# Patient Record
Sex: Female | Born: 1970 | Race: Black or African American | Hispanic: No | Marital: Married | Smoking: Never smoker
Health system: Southern US, Community
[De-identification: ages and names within clinical notes are randomized; demographics above are authoritative.]

## PROBLEM LIST (undated history)

## (undated) DIAGNOSIS — I10 Essential (primary) hypertension: Secondary | ICD-10-CM

## (undated) DIAGNOSIS — D869 Sarcoidosis, unspecified: Secondary | ICD-10-CM

## (undated) HISTORY — PX: LUNG BIOPSY: SHX232

---

## 2011-05-27 ENCOUNTER — Other Ambulatory Visit: Payer: Self-pay

## 2011-05-27 ENCOUNTER — Encounter (HOSPITAL_COMMUNITY): Payer: Self-pay | Admitting: *Deleted

## 2011-05-27 ENCOUNTER — Emergency Department (HOSPITAL_COMMUNITY)
Admission: EM | Admit: 2011-05-27 | Discharge: 2011-05-27 | Disposition: A | Discharge status: Home / Self Care | Attending: Emergency Medicine | Admitting: Emergency Medicine

## 2011-05-27 DIAGNOSIS — E876 Hypokalemia: Secondary | ICD-10-CM | POA: Insufficient documentation

## 2011-05-27 DIAGNOSIS — R002 Palpitations: Secondary | ICD-10-CM

## 2011-05-27 LAB — POCT I-STAT, CHEM 8
BUN: 10 mg/dL (ref 6–23)
Calcium, Ion: 1.17 mmol/L (ref 1.12–1.32)
Chloride: 106 mEq/L (ref 96–112)
Creatinine, Ser: 0.8 mg/dL (ref 0.50–1.10)
Glucose, Bld: 111 mg/dL — ABNORMAL HIGH (ref 70–99)
HCT: 39 % (ref 36.0–46.0)
Hemoglobin: 13.3 g/dL (ref 12.0–15.0)
Potassium: 3.3 mEq/L — ABNORMAL LOW (ref 3.5–5.1)
Sodium: 139 mEq/L (ref 135–145)
TCO2: 21 mmol/L (ref 0–100)

## 2011-05-27 LAB — PREGNANCY, URINE: Preg Test, Ur: NEGATIVE

## 2011-05-27 MED ORDER — POTASSIUM CHLORIDE CRYS ER 20 MEQ PO TBCR
20.0000 meq | EXTENDED_RELEASE_TABLET | Freq: Once | ORAL | Status: AC
  Administered 2011-05-27: 20 meq via ORAL
  Filled 2011-05-27: qty 1

## 2011-05-27 NOTE — ED Notes (Signed)
Report received from [place], [place]

## 2011-05-27 NOTE — ED Provider Notes (Signed)
History    41-year-old female with palpitations. Patient states that she feels like her is racing. Denies chest pain or shortness of breath. No unusual leg pain or swelling. Denies history of blood clot. Denies significant history of similar symptoms. Denies history of thyroid dysfunction. Patient does state that she recently started taking a diet pill for weight loss and also has been drinking diet shakes. Denies drug use. Does not think she is pregnant.  CSN: [id number]  Arrival date & time 05/27/11  1054   First MD Initiated Contact with Patient 05/27/11 1100      No chief complaint on file.   (Consider location/radiation/quality/duration/timing/severity/associated sxs/prior treatment) HPI  History reviewed. No pertinent past medical history.  History reviewed. No pertinent past surgical history.  No family history on file.  History  Substance Use Topics  . Smoking status: Not on file  . Smokeless tobacco: Not on file  . Alcohol Use: Not on file    OB History    Grav Para Term Preterm Abortions TAB SAB Ect Mult Living                  Review of Systems   Review of symptoms negative unless otherwise noted in HPI.   Allergies  Review of patient's allergies indicates not on file.  Home Medications   Current Outpatient Rx  Name Route Sig Dispense Refill  . OVER THE COUNTER MEDICATION Oral Take 1 tablet by mouth daily. Refirm PM diet pill      BP 153/101  Pulse 102  Resp 18  SpO2 100%  Physical Exam  Nursing note and vitals reviewed. Constitutional: She appears well-developed and well-nourished. No distress.  HENT:  Head: Normocephalic and atraumatic.  Eyes: Conjunctivae are normal. Right eye exhibits no discharge. Left eye exhibits no discharge.  Neck: Neck supple.  Cardiovascular: Normal rate, regular rhythm and normal heart sounds.  Exam reveals no gallop and no friction rub.   No murmur heard.      Regular rhythm and rate. No ectopic beats appreciated.  No flow murmur.  Pulmonary/Chest: Effort normal and breath sounds normal. No respiratory distress.  Abdominal: Soft. She exhibits no distension. There is no tenderness.  Musculoskeletal: She exhibits no edema and no tenderness.       Lower extremities symmetric as compared to each other. There is no edema. No calf tenderness. No palpable cords. Negative Homans  Neurological: She is alert.  Skin: Skin is warm and dry.  Psychiatric: She has a normal mood and affect. Her behavior is normal. Thought content normal.    ED Course  Procedures (including critical care time)  Labs Reviewed  POCT I-STAT, CHEM 8 - Abnormal; Notable for the following:    Potassium 3.3 (*)    Glucose, Bld 111 (*)    All other components within normal limits  PREGNANCY, URINE   No results found.  EKG:  Rhythm: NSR Rate: 79 Axis: normal Intervals: normal ST segments: NS ST changes.   1. Palpitations       MDM  41 year old female with palpitations. Suspect this is likely secondary to recent intake of diet pills and diet shakes.EKG with a normal sinus rhythm and normal intervals. Very mild hypokalemia but doubt this the etiology of patient's complaints. Patient is not pregnant.patient is hemodynamically stable. Consider PE but doubt. Patient counseled concerning the use of weight loss supplements. Return precautions were discussed. Outpatient followup.      Raeford Razor, MD 05/31/11 [phone]

## 2011-05-27 NOTE — ED Notes (Signed)
Denies chest pain, SOB, N/V with ems

## 2011-05-27 NOTE — Discharge Instructions (Signed)
Palpitations  A palpitation is the feeling that your heartbeat is irregular or is faster than normal. Although this is frightening, it usually is not serious. Palpitations may be caused by excesses of smoking, caffeine, or alcohol. They are also brought on by stress and anxiety. Sometimes, they are caused by heart disease. Unless otherwise noted, your caregiver did not find any signs of serious illness at this time. HOME CARE INSTRUCTIONS  To help prevent palpitations:  Drink decaffeinated coffee, tea, and soda pop. Avoid chocolate.   If you smoke or drink alcohol, quit or cut down as much as possible.   Reduce your stress or anxiety level. Biofeedback, yoga, or meditation will help you relax. Physical activity such as swimming, jogging, or walking also may be helpful.  SEEK MEDICAL CARE IF:   You continue to have a fast heartbeat.   Your palpitations occur more often.  SEEK IMMEDIATE MEDICAL CARE IF: You develop chest pain, shortness of breath, severe headache, dizziness, or fainting. Document Released: 03/18/2000 Document Revised: 12/01/2010 Document Reviewed: 05/18/2007 [facility] Patient Information 2012 [place], [place].  RESOURCE GUIDE  Dental Problems  Patients with Medicaid: [facility] [phone]                                           [phone] [address] Phone:  [phone]                                                  Phone:  [phone]  If unable to pay or uninsured, contact:  [facility] or [facility]. to become qualified for the adult dental clinic.  Chronic Pain Problems Contact [facility]  [phone] Patients need to be referred by their primary care doctor.  Insufficient Money for Medicine Contact [employer] Way:  call "211" or [facility] [phone].  No Primary Care Doctor Call [facility]  [phone] Other agencies that provide inexpensive medical care  [facility]  [phone]    [facility]  [phone]    [facility]  [phone]    [facility]  [phone]    [facility]  [phone]    [facility]  [phone]  Psychological Services [facility] Behavior[facility]  [phone] [facility] Services  [phone] [facility] Mental Health   [phone] (emergency services [phone])  Substance Abuse Resources [facility]  [phone] [facility] [phone] [facility] [phone] Floydene Flock [phone] Residential & Outpatient Substance Abuse Program  [phone]  Abuse/Neglect [facility] Child Abuse Hotline 424-733-7913 [facility] Child Abuse Hotline 743-039-2089 (After Hours)  Emergency Shelter [facility] Ministries [phone]  Maternity Homes [facility] at the [place] [facility] [place] [phone] [facility] [phone]  MRSA Hotline #:   520 585 1728    [facility] Resources  [facility]     [employer]                          [facility]. [address] [place]                       [address]      [address]  Blondell Reveal Phone:  [phone]                                   Phone:  [phone]                 Phone:  [phone]  [facility] Mental Health Phone:  [phone]  [facility] Child Abuse Hotline 925-769-1481 [phone] (After Hours)

## 2011-05-27 NOTE — ED Notes (Addendum)
Per EMS- pt took a diet pill last night. Began having palpitations, feeling anxious, and shakey, pt went to an urgent care where she was told to come to ED. Bp150/100 hr 96 o2 100%. 20G LAC. Per EMS pt appeared to have a run of V.Tach prior to arrival.

## 2012-02-16 ENCOUNTER — Other Ambulatory Visit (HOSPITAL_COMMUNITY): Payer: Self-pay | Admitting: Cardiovascular Disease

## 2012-02-16 DIAGNOSIS — I10 Essential (primary) hypertension: Secondary | ICD-10-CM

## 2012-02-23 ENCOUNTER — Encounter (HOSPITAL_COMMUNITY)

## 2012-03-08 ENCOUNTER — Encounter (HOSPITAL_COMMUNITY)

## 2013-03-06 ENCOUNTER — Ambulatory Visit: Payer: Self-pay | Admitting: *Deleted

## 2014-02-26 ENCOUNTER — Emergency Department: Payer: Self-pay | Admitting: Emergency Medicine

## 2015-12-23 ENCOUNTER — Emergency Department
Admission: EM | Admit: 2015-12-23 | Discharge: 2015-12-23 | Disposition: A | Discharge status: Home / Self Care | Payer: Self-pay | Attending: Emergency Medicine | Admitting: Emergency Medicine

## 2015-12-23 ENCOUNTER — Encounter: Payer: Self-pay | Admitting: Intensive Care

## 2015-12-23 ENCOUNTER — Emergency Department: Payer: Self-pay

## 2015-12-23 DIAGNOSIS — R079 Chest pain, unspecified: Secondary | ICD-10-CM | POA: Insufficient documentation

## 2015-12-23 DIAGNOSIS — D869 Sarcoidosis, unspecified: Secondary | ICD-10-CM | POA: Insufficient documentation

## 2015-12-23 DIAGNOSIS — I10 Essential (primary) hypertension: Secondary | ICD-10-CM | POA: Insufficient documentation

## 2015-12-23 DIAGNOSIS — Z791 Long term (current) use of non-steroidal anti-inflammatories (NSAID): Secondary | ICD-10-CM | POA: Insufficient documentation

## 2015-12-23 DIAGNOSIS — Z79899 Other long term (current) drug therapy: Secondary | ICD-10-CM | POA: Insufficient documentation

## 2015-12-23 HISTORY — DX: Sarcoidosis, unspecified: D86.9

## 2015-12-23 HISTORY — DX: Essential (primary) hypertension: I10

## 2015-12-23 LAB — BASIC METABOLIC PANEL
ANION GAP: 9 (ref 5–15)
BUN: 12 mg/dL (ref 6–20)
CALCIUM: 9.6 mg/dL (ref 8.9–10.3)
CHLORIDE: 104 mmol/L (ref 101–111)
CO2: 24 mmol/L (ref 22–32)
Creatinine, Ser: 1.08 mg/dL — ABNORMAL HIGH (ref 0.44–1.00)
GFR calc non Af Amer: >60 mL/min (ref >60)
GLUCOSE: 86 mg/dL (ref 65–99)
Potassium: 3.2 mmol/L — ABNORMAL LOW (ref 3.5–5.1)
Sodium: 137 mmol/L (ref 135–145)

## 2015-12-23 LAB — CBC
HCT: 42.6 % (ref 35.0–47.0)
HEMOGLOBIN: 14.7 g/dL (ref 12.0–16.0)
MCH: 30.3 pg (ref 26.0–34.0)
MCHC: 34.5 g/dL (ref 32.0–36.0)
MCV: 87.9 fL (ref 80.0–100.0)
Platelets: 200 10*3/uL (ref 150–440)
RBC: 4.85 MIL/uL (ref 3.80–5.20)
RDW: 14.2 % (ref 11.5–14.5)
WBC: 6.8 10*3/uL (ref 3.6–11.0)

## 2015-12-23 LAB — TROPONIN I

## 2015-12-23 MED ORDER — PREDNISONE 20 MG PO TABS: 40.0000 mg | ORAL_TABLET | Freq: Every day | ORAL | 0 refills | Status: DC

## 2015-12-23 MED ORDER — ALBUTEROL SULFATE HFA 108 (90 BASE) MCG/ACT IN AERS: 2.0000 | INHALATION_SPRAY | Freq: Four times a day (QID) | RESPIRATORY_TRACT | 2 refills | Status: AC | PRN

## 2015-12-23 MED ORDER — IPRATROPIUM-ALBUTEROL 0.5-2.5 (3) MG/3ML IN SOLN
3.0000 mL | Freq: Once | RESPIRATORY_TRACT | Status: AC
  Administered 2015-12-23: 3 mL via RESPIRATORY_TRACT
  Filled 2015-12-23: qty 3

## 2015-12-23 MED ORDER — IOPAMIDOL (ISOVUE-370) INJECTION 76%
75.0000 mL | Freq: Once | INTRAVENOUS | Status: AC | PRN
  Administered 2015-12-23: 75 mL via INTRAVENOUS

## 2015-12-23 MED ORDER — PREDNISONE 20 MG PO TABS
40.0000 mg | ORAL_TABLET | Freq: Once | ORAL | Status: AC
  Administered 2015-12-23: 40 mg via ORAL
  Filled 2015-12-23: qty 2

## 2015-12-23 NOTE — ED Triage Notes (Signed)
Pt with shortness of breath and swelling of ankles  for one week. No acute respiratory distress noted.

## 2015-12-23 NOTE — ED Notes (Signed)
Pt to radiology for US

## 2015-12-23 NOTE — ED Provider Notes (Signed)
[facility] Emergency Department Provider Note   ____________________________________________   First MD Initiated Contact with Patient 12/23/15 1114     (approximate)  I have reviewed the triage vital signs and the nursing notes.   HISTORY  Chief Complaint Shortness of Breath and Sarcoidosis    HPI Peggy Owens is a 45 y.o. female reports a previous history of sarcoid. Patient reports history of steroids with good relief roughly a year ago and follows with pulmonologist [facility]  Patient reports that for the last 2 weeks she's been feeling as though she has occasional cough with twinges of sharp pain in the left mid chest. No wheezing or fever, and she currently reports that she is not short of breath but she will feel short of breath during episodes where she is coughing. She does report recent travel and drove about 20 hours straight to [place], thereafter she knows that she had some slight swelling in her legs and reports it isn't coming and going.  Presently denies feeling short of breath. Pain-free right now, reports the pain in twinges in her chest will come and go sometime lasting as much as 20 minutes for the last couple of weeks. She does not have any sense of tightness or pressure in the chest. She did not have any radiating pain up the back. Walking and exertion do not make the chest pain, bout. She describes as a fairly sharp feeling sometimes worsened by taking a deep breath.  Senna previous tubal ligation, denies pregnancy and does not take any hormones. She does not smoke.    Past Medical History:  Diagnosis Date  . Hypertension   . Sarcoidosis (HCC)     There are no active problems to display for this patient.   Past Surgical History:  Procedure Laterality Date  . LUNG BIOPSY Left     Prior to Admission medications   Medication Sig Start Date End Date Taking? Authorizing Provider  fluticasone (FLONASE) 50 MCG/ACT nasal  spray Place 1 spray into both nostrils daily.   Yes Historical Provider, MD  ibuprofen (ADVIL,MOTRIN) 200 MG tablet Take 200 mg by mouth every 6 (six) hours as needed.   Yes Historical Provider, MD  albuterol (PROVENTIL HFA;VENTOLIN HFA) 108 (90 Base) MCG/ACT inhaler Inhale 2 puffs into the lungs every 6 (six) hours as needed for wheezing or shortness of breath. 12/23/15   Sharyn Creamer, MD  predniSONE (DELTASONE) 20 MG tablet Take 2 tablets (40 mg total) by mouth daily with breakfast. 12/23/15   Sharyn Creamer, MD    Allergies Review of patient's allergies indicates no known allergies.  History reviewed. No pertinent family history.  Social History Social History  Substance Use Topics  . Smoking status: Never Smoker  . Smokeless tobacco: Never Used  . Alcohol use Yes     Comment: Occ    Review of Systems Constitutional: No fever/chills Eyes: No visual changes. ENT: No sore throat. Cardiovascular: See history of present illness Respiratory: See history of present illness Gastrointestinal: No abdominal pain.  No nausea, no vomiting.  No diarrhea.  No constipation. Genitourinary: Negative for dysuria. Musculoskeletal: Negative for back pain. Skin: Negative for rash. Neurological: Negative for headaches, focal weakness or numbness.  10-point ROS otherwise negative.  ____________________________________________   PHYSICAL EXAM:  VITAL SIGNS: ED Triage Vitals  Enc Vitals Group     BP 12/23/15 1034 (!) 169/106     Pulse Rate 12/23/15 1034 70     Resp 12/23/15 1034  16     Temp 12/23/15 1034 98 F (36.7 C)     Temp Source 12/23/15 1034 Oral     SpO2 12/23/15 1034 99 %     Weight 12/23/15 1036 174 lb (78.9 kg)     Height 12/23/15 1036 5\' 4"  (1.626 m)     Head Circumference --      Peak Flow --      Pain Score --      Pain Loc --      Pain Edu? --      Excl. in GC? --     Constitutional: Alert and oriented. Well appearing and in no acute distress. Eyes: Conjunctivae are normal.  PERRL. EOMI. Head: Atraumatic. Nose: No congestion/rhinnorhea. Mouth/Throat: Mucous membranes are moist.  Oropharynx non-erythematous. Neck: No stridor.   Cardiovascular: Normal rate, regular rhythm. Grossly normal heart sounds.  Good peripheral circulation. Respiratory: Normal respiratory effort.  No retractions. Lungs CTAB.Using full and clear sentences. Gastrointestinal: Soft and nontender. No distention. No abdominal bruits. No CVA tenderness. Musculoskeletal: No lower extremity tenderness nor edemaExcept for a trace amount of peripheral edema at the ankles.  No joint effusions. Neurologic:  Normal speech and language. No gross focal neurologic deficits are appreciated.  Skin:  Skin is warm, dry and intact. No rash noted. Psychiatric: Mood and affect are normal. Speech and behavior are normal.  ____________________________________________   LABS (all labs ordered are listed, but only abnormal results are displayed)  Labs Reviewed  BASIC METABOLIC PANEL - Abnormal; Notable for the following:       Result Value   Potassium 3.2 (*)    Creatinine, Ser 1.08 (*)    All other components within normal limits  CBC  TROPONIN I   ____________________________________________  EKG  ED ECG REPORT I, QUALE, MARK, the attending physician, personally viewed and interpreted this ECG.  Date: 12/23/2015 EKG Time: 1045 Rate: 70 Rhythm: normal sinus rhythm QRS Axis: normal Intervals: normal ST/T Wave abnormalities: normal Conduction Disturbances: none Narrative Interpretation: unremarkable  ____________________________________________  RADIOLOGY  Ct Angio Chest Pe W Or Wo Contrast  Result Date: 12/23/2015 CLINICAL DATA:  Swelling of the ankles beginning last month. Clinical diagnosis of sarcoid. Cough and chest pain. EXAM: CT ANGIOGRAPHY CHEST WITH CONTRAST TECHNIQUE: Multidetector CT imaging of the chest was performed using the standard protocol during bolus administration of  intravenous contrast. Multiplanar CT image reconstructions and MIPs were obtained to evaluate the vascular anatomy. CONTRAST:  75 cc Isovue 370 COMPARISON:  07/08/2013 FINDINGS: Cardiovascular: Pulmonary arterial opacification is excellent. No pulmonary emboli. Systemic arterial opacification is excellent. No atherosclerosis, aneurysm or dissection. No coronary artery calcification. Mediastinum/Nodes: No enlarged hilar or mediastinal lymph nodes. Previously seen lymphadenopathy is resolved. Lungs/Pleura: Increased interstitial markings diffusely. Micronodular pattern in the perihilar regions diffusely, consistent with clinical diagnosis of sarcoid. Few benign subpleural nodules. No evidence of infiltrate, collapse or pleural effusion. Upper Abdomen: Normal Musculoskeletal: Negative Review of the MIP images confirms the above findings. IMPRESSION: No pulmonary emboli or acute vascular pathology. Resolution of previously seen mediastinal lymphadenopathy. Persistent interstitial pulmonary prominence consistent with the clinical diagnosis of sarcoid. Electronically Signed   By: Paulina Fusi M.D.   On: 12/23/2015 13:25   US Venous Img Lower Bilateral  Result Date: 12/23/2015 CLINICAL DATA:  Lower extremity swelling around both ankles. EXAM: BILATERAL LOWER EXTREMITY VENOUS DOPPLER ULTRASOUND TECHNIQUE: Gray-scale sonography with graded compression, as well as color Doppler and duplex ultrasound were performed to evaluate the lower extremity deep venous systems  from the level of the common femoral vein and including the common femoral, femoral, profunda femoral, popliteal and calf veins including the posterior tibial, peroneal and gastrocnemius veins when visible. The superficial great saphenous vein was also interrogated. Spectral Doppler was utilized to evaluate flow at rest and with distal augmentation maneuvers in the common femoral, femoral and popliteal veins. COMPARISON:  None. FINDINGS: RIGHT LOWER EXTREMITY  Common Femoral Vein: No evidence of thrombus. Normal compressibility, respiratory phasicity and response to augmentation. Saphenofemoral Junction: No evidence of thrombus. Normal compressibility and flow on color Doppler imaging. Profunda Femoral Vein: No evidence of thrombus. Normal compressibility and flow on color Doppler imaging. Femoral Vein: No evidence of thrombus. Normal compressibility, respiratory phasicity and response to augmentation. Popliteal Vein: No evidence of thrombus. Normal compressibility, respiratory phasicity and response to augmentation. Calf Veins: Visualized right deep calf veins are patent without thrombus. LEFT LOWER EXTREMITY Common Femoral Vein: No evidence of thrombus. Normal compressibility, respiratory phasicity and response to augmentation. Saphenofemoral Junction: No evidence of thrombus. Normal compressibility and flow on color Doppler imaging. Profunda Femoral Vein: No evidence of thrombus. Normal compressibility and flow on color Doppler imaging. Femoral Vein: No evidence of thrombus. Normal compressibility, respiratory phasicity and response to augmentation. Popliteal Vein: No evidence of thrombus. Normal compressibility, respiratory phasicity and response to augmentation. Calf Veins: Visualized left deep calf veins are patent without thrombus. IMPRESSION: No evidence of deep venous thrombosis in the lower extremities. Electronically Signed   By: Richarda OverlieAdam  Henn M.D.   On: 12/23/2015 12:29    ____________________________________________   PROCEDURES  Procedure(s) performed: None  Procedures  Critical Care performed: No  ____________________________________________   INITIAL IMPRESSION / ASSESSMENT AND PLAN / ED COURSE  Pertinent labs & imaging results that were available during my care of the patient were reviewed by me and considered in my medical decision making (see chart for details).  Patient presents for evaluation of mild symptoms of dyspnea with  associated sharp left-sided chest pains for about the last 2 weeks. She associates this with coming back after a trip from [place], she does have some trace amount of edema in her ankles. No clear evidence of a DVT by clinical exam, but given the history it does raise suspicion for possible venous thromboembolism and we will further evaluate with CT with contrast. She also carries a history of sarcoid, and reports that she had somewhat similar symptoms a year ago and she was diagnosed, and that improved with treatment with steroids and inhalers. I doubt acute coronary syndrome, reassuring and normal EKG with a normal troponin. Atypical symptoms. No infectious symptoms. Describes a nonproductive cough, shortness of breath made worse during episodes of cough no other travel except in the [place].  Clinical Course     ____________________________________________   FINAL CLINICAL IMPRESSION(S) / ED DIAGNOSES  Final diagnoses:  Left sided chest pain  Sarcoidosis (HCC)      NEW MEDICATIONS STARTED DURING THIS VISIT:  New Prescriptions   ALBUTEROL (PROVENTIL HFA;VENTOLIN HFA) 108 (90 BASE) MCG/ACT INHALER    Inhale 2 puffs into the lungs every 6 (six) hours as needed for wheezing or shortness of breath.   PREDNISONE (DELTASONE) 20 MG TABLET    Take 2 tablets (40 mg total) by mouth daily with breakfast.     Note:  This document was prepared using Dragon voice recognition software and may include unintentional dictation errors.     Sharyn CreamerMark Quale, MD 12/23/15 1416

## 2015-12-23 NOTE — ED Notes (Signed)
Pt ambulated to bathroom. Tech hooked pt back up to monitor.

## 2015-12-23 NOTE — ED Triage Notes (Signed)
Pt presents to ER with c/o swollen ankles off and on since the beginning of August. Pt reports being diagnosed with sarcoidosis and finished trx about 8months ago. Pt reports symptoms coming back and experiencing coughing, sharp chest pain on L side, and SOB. No breathing difficulties noted in triage and pt ambulatory.

## 2016-08-14 ENCOUNTER — Emergency Department: Payer: Self-pay

## 2016-08-14 ENCOUNTER — Emergency Department
Admission: EM | Admit: 2016-08-14 | Discharge: 2016-08-14 | Disposition: A | Discharge status: Home / Self Care | Payer: Self-pay | Attending: Emergency Medicine | Admitting: Emergency Medicine

## 2016-08-14 ENCOUNTER — Encounter: Payer: Self-pay | Admitting: Intensive Care

## 2016-08-14 DIAGNOSIS — R0789 Other chest pain: Secondary | ICD-10-CM | POA: Insufficient documentation

## 2016-08-14 DIAGNOSIS — J90 Pleural effusion, not elsewhere classified: Secondary | ICD-10-CM | POA: Insufficient documentation

## 2016-08-14 DIAGNOSIS — Z79899 Other long term (current) drug therapy: Secondary | ICD-10-CM | POA: Insufficient documentation

## 2016-08-14 DIAGNOSIS — R079 Chest pain, unspecified: Secondary | ICD-10-CM

## 2016-08-14 DIAGNOSIS — I10 Essential (primary) hypertension: Secondary | ICD-10-CM | POA: Insufficient documentation

## 2016-08-14 LAB — TROPONIN I: Troponin I: <0.03 ng/mL (ref <0.03)

## 2016-08-14 LAB — BASIC METABOLIC PANEL
Anion gap: 6 (ref 5–15)
BUN: 12 mg/dL (ref 6–20)
CHLORIDE: 112 mmol/L — AB (ref 101–111)
CO2: 22 mmol/L (ref 22–32)
CREATININE: 1.32 mg/dL — AB (ref 0.44–1.00)
Calcium: 9 mg/dL (ref 8.9–10.3)
GFR calc Af Amer: 56 mL/min — ABNORMAL LOW (ref >60)
GFR calc non Af Amer: 48 mL/min — ABNORMAL LOW (ref >60)
Glucose, Bld: 103 mg/dL — ABNORMAL HIGH (ref 65–99)
Potassium: 3.7 mmol/L (ref 3.5–5.1)
Sodium: 140 mmol/L (ref 135–145)

## 2016-08-14 LAB — CBC
HCT: 40.8 % (ref 35.0–47.0)
Hemoglobin: 13.9 g/dL (ref 12.0–16.0)
MCH: 30.6 pg (ref 26.0–34.0)
MCHC: 34 g/dL (ref 32.0–36.0)
MCV: 89.9 fL (ref 80.0–100.0)
PLATELETS: 190 10*3/uL (ref 150–440)
RBC: 4.54 MIL/uL (ref 3.80–5.20)
RDW: 14.6 % — AB (ref 11.5–14.5)
WBC: 5.8 10*3/uL (ref 3.6–11.0)

## 2016-08-14 LAB — FIBRIN DERIVATIVES D-DIMER (ARMC ONLY): Fibrin derivatives D-dimer (ARMC): 1513.27 — ABNORMAL HIGH (ref 0.00–499.00)

## 2016-08-14 MED ORDER — SODIUM CHLORIDE 0.9 % IV BOLUS (SEPSIS)
1000.0000 mL | Freq: Once | INTRAVENOUS | Status: AC
Start: 2016-08-14 — End: 2016-08-14
  Administered 2016-08-14: 1000 mL via INTRAVENOUS

## 2016-08-14 MED ORDER — KETOROLAC TROMETHAMINE 60 MG/2ML IM SOLN
30.0000 mg | Freq: Once | INTRAMUSCULAR | Status: AC
  Administered 2016-08-14: 30 mg via INTRAMUSCULAR

## 2016-08-14 MED ORDER — NAPROXEN 500 MG PO TABS: 500.0000 mg | ORAL_TABLET | Freq: Two times a day (BID) | ORAL | 0 refills | Status: AC

## 2016-08-14 MED ORDER — KETOROLAC TROMETHAMINE 30 MG/ML IJ SOLN
INTRAMUSCULAR | Status: AC
  Filled 2016-08-14: qty 1

## 2016-08-14 MED ORDER — AZITHROMYCIN 250 MG PO TABS: ORAL_TABLET | ORAL | 0 refills | Status: AC

## 2016-08-14 MED ORDER — IOPAMIDOL (ISOVUE-370) INJECTION 76%
75.0000 mL | Freq: Once | INTRAVENOUS | Status: AC | PRN
  Administered 2016-08-14: 75 mL via INTRAVENOUS
  Filled 2016-08-14: qty 75

## 2016-08-14 NOTE — ED Provider Notes (Signed)
[facility] Emergency Department Provider Note   ____________________________________________   I have reviewed the triage vital signs and the nursing notes.   HISTORY  Chief Complaint Chest Pain   History limited by: Not Limited   HPI Peggy Owens is a 46 y.o. female who presents to the emergency department today because of concerns for chest pain. It is located on the left side of her chest. It started 2 days ago. Has been constant since then. It is gradually beginning worse. It is worse with pressure and in certain positions. It is worse with deep breaths. She has not had any significant cough with it. She has not had any measured fevers. Denies similar pain in the past.   Past Medical History:  Diagnosis Date  . Hypertension   . Sarcoidosis     There are no active problems to display for this patient.   Past Surgical History:  Procedure Laterality Date  . LUNG BIOPSY Left     Prior to Admission medications   Medication Sig Start Date End Date Taking? Authorizing Provider  albuterol (PROVENTIL HFA;VENTOLIN HFA) 108 (90 Base) MCG/ACT inhaler Inhale 2 puffs into the lungs every 6 (six) hours as needed for wheezing or shortness of breath. 12/23/15   Sharyn CreamerQuale, Mark, MD  fluticasone (FLONASE) 50 MCG/ACT nasal spray Place 1 spray into both nostrils daily.    [provider]  ibuprofen (ADVIL,MOTRIN) 200 MG tablet Take 200 mg by mouth every 6 (six) hours as needed.    [provider]  predniSONE (DELTASONE) 20 MG tablet Take 2 tablets (40 mg total) by mouth daily with breakfast. 12/23/15   Sharyn CreamerQuale, Mark, MD    Allergies Patient has no known allergies.  History reviewed. No pertinent family history.  Social History Social History  Substance Use Topics  . Smoking status: Never Smoker  . Smokeless tobacco: Never Used  . Alcohol use Yes     Comment: Occ    Review of Systems Constitutional: No fever/chills Eyes: No visual  changes. ENT: No sore throat. Cardiovascular: Positive for chest pain. Respiratory: Denies shortness of breath. Gastrointestinal: No abdominal pain.  No nausea, no vomiting.  No diarrhea.   Genitourinary: Negative for dysuria. Musculoskeletal: Negative for back pain. Skin: Negative for rash. Neurological: Negative for headaches, focal weakness or numbness.  ____________________________________________   PHYSICAL EXAM:  VITAL SIGNS: ED Triage Vitals  Enc Vitals Group     BP 08/14/16 1805 (!) 175/93     Pulse Rate 08/14/16 1805 (!) 118     Resp 08/14/16 1805 20     Temp 08/14/16 1805 99.4 F (37.4 C)     Temp Source 08/14/16 1805 Oral     SpO2 08/14/16 1805 98 %     Weight 08/14/16 1806 180 lb (81.6 kg)     Height 08/14/16 1806 5\' 4"  (1.626 m)     Head Circumference --      Peak Flow --      Pain Score 08/14/16 1805 7   Constitutional: Alert and oriented. Well appearing and in no distress. Eyes: Conjunctivae are normal. Normal extraocular movements. ENT   Head: Normocephalic and atraumatic.   Nose: No congestion/rhinnorhea.   Mouth/Throat: Mucous membranes are moist.   Neck: No stridor. Hematological/Lymphatic/Immunilogical: No cervical lymphadenopathy. Cardiovascular: Tachycardic, regular rhythm.  No murmurs, rubs, or gallops.  Respiratory: Normal respiratory effort without tachypnea nor retractions. Breath sounds are clear and equal bilaterally. No wheezes/rales/rhonchi. Gastrointestinal: Soft and non tender. No rebound. No guarding.  Genitourinary: Deferred Musculoskeletal: Normal range of motion in all extremities. No lower extremity edema. Neurologic:  Normal speech and language. No gross focal neurologic deficits are appreciated.  Skin:  Skin is warm, dry and intact. No rash noted. Psychiatric: Mood and affect are normal. Speech and behavior are normal. Patient exhibits appropriate insight and judgment.  ____________________________________________     LABS (pertinent positives/negatives)  Labs Reviewed  BASIC METABOLIC PANEL - Abnormal; Notable for the following:       Result Value   Chloride 112 (*)    Glucose, Bld 103 (*)    Creatinine, Ser 1.32 (*)    GFR calc non Af Amer 48 (*)    GFR calc Af Amer 56 (*)    All other components within normal limits  CBC - Abnormal; Notable for the following:    RDW 14.6 (*)    All other components within normal limits  FIBRIN DERIVATIVES D-DIMER ([facility] ONLY) - Abnormal; Notable for the following:    Fibrin derivatives D-dimer [facility]) 1,610.96 (*)    All other components within normal limits  TROPONIN I    ____________________________________________   EKG  I, Phineas Semen, attending physician, personally viewed and interpreted this EKG  EKG Time: 1806 Rate: 108 Rhythm: sinus tachycardia Axis: normal Intervals: qtc 458 QRS: narrow ST changes: no st elevation Impression: abnormal ekg   ____________________________________________    RADIOLOGY  CXR IMPRESSION:  Small left pleural effusion.    Associated mild left basilar opacity, likely atelectasis.   CT angio  IMPRESSION: No evidence of pulmonary embolism.  Small left pleural effusion.  Mild patchy opacities in the lingula and left lower lobe, likely atelectasis. ____________________________________________   PROCEDURES  Procedures  ____________________________________________   INITIAL IMPRESSION / ASSESSMENT AND PLAN / ED COURSE  Pertinent labs & imaging results that were available during my care of the patient were reviewed by me and considered in my medical decision making (see chart for details).  Patient presented to the emergency department today because of concern for chest pain. CXR showed a pleural effusion. Patient was tachycardic and d-dimer was checked. It was high. CT angio was negative for PE. Will place patient on antibiotics and NSAID.    ____________________________________________   FINAL CLINICAL IMPRESSION(S) / ED DIAGNOSES  Final diagnoses:  Left sided chest pain  Pleural effusion  Chest pain, unspecified type     Note: This dictation was prepared with Dragon dictation. Any transcriptional errors that result from this process are unintentional     Phineas Semen, MD 08/14/16 2102

## 2016-08-14 NOTE — Discharge Instructions (Signed)
Please seek medical attention for any high fevers, chest pain, shortness of breath, change in behavior, persistent vomiting, bloody stool or any other new or concerning symptoms.  

## 2016-08-14 NOTE — ED Triage Notes (Addendum)
Patient presents to ER with c/o central and left sided constant chest pressure that started Friday. Ambulatory in triage with no problems. No distress noted. Patient takes b/p medicine and ran out X3 weeks ago. Does not have a PCP and has not gotten meds refilled

## 2016-08-14 NOTE — ED Notes (Signed)
Patient transported to CT 

## 2017-04-05 ENCOUNTER — Encounter (HOSPITAL_COMMUNITY): Payer: Self-pay | Admitting: Emergency Medicine

## 2017-04-05 ENCOUNTER — Emergency Department (HOSPITAL_COMMUNITY): Payer: Self-pay

## 2017-04-05 ENCOUNTER — Emergency Department (HOSPITAL_COMMUNITY)
Admission: EM | Admit: 2017-04-05 | Discharge: 2017-04-05 | Disposition: A | Discharge status: Home / Self Care | Payer: Self-pay | Attending: Emergency Medicine | Admitting: Emergency Medicine

## 2017-04-05 ENCOUNTER — Other Ambulatory Visit: Payer: Self-pay

## 2017-04-05 DIAGNOSIS — Z79899 Other long term (current) drug therapy: Secondary | ICD-10-CM | POA: Insufficient documentation

## 2017-04-05 DIAGNOSIS — I10 Essential (primary) hypertension: Secondary | ICD-10-CM | POA: Insufficient documentation

## 2017-04-05 DIAGNOSIS — R0789 Other chest pain: Secondary | ICD-10-CM | POA: Insufficient documentation

## 2017-04-05 LAB — CBC
HEMATOCRIT: 38.8 % (ref 36.0–46.0)
Hemoglobin: 13 g/dL (ref 12.0–15.0)
MCH: 28.6 pg (ref 26.0–34.0)
MCHC: 33.5 g/dL (ref 30.0–36.0)
MCV: 85.5 fL (ref 78.0–100.0)
Platelets: 268 10*3/uL (ref 150–400)
RBC: 4.54 MIL/uL (ref 3.87–5.11)
RDW: 14.4 % (ref 11.5–15.5)
WBC: 3.7 10*3/uL — ABNORMAL LOW (ref 4.0–10.5)

## 2017-04-05 LAB — BASIC METABOLIC PANEL
Anion gap: 7 (ref 5–15)
BUN: 11 mg/dL (ref 6–20)
CHLORIDE: 105 mmol/L (ref 101–111)
CO2: 25 mmol/L (ref 22–32)
Calcium: 9.1 mg/dL (ref 8.9–10.3)
Creatinine, Ser: 1.16 mg/dL — ABNORMAL HIGH (ref 0.44–1.00)
GFR calc non Af Amer: 56 mL/min — ABNORMAL LOW (ref >60)
Glucose, Bld: 109 mg/dL — ABNORMAL HIGH (ref 65–99)
POTASSIUM: 3.6 mmol/L (ref 3.5–5.1)
Sodium: 137 mmol/L (ref 135–145)

## 2017-04-05 LAB — I-STAT BETA HCG BLOOD, ED (MC, WL, AP ONLY): I-stat hCG, quantitative: <5 m[IU]/mL (ref <5)

## 2017-04-05 LAB — I-STAT TROPONIN, ED
Troponin i, poc: 0 ng/mL (ref 0.00–0.08)
Troponin i, poc: 0 ng/mL (ref 0.00–0.08)

## 2017-04-05 MED ORDER — IOPAMIDOL (ISOVUE-300) INJECTION 61%
INTRAVENOUS | Status: AC
  Filled 2017-04-05: qty 75

## 2017-04-05 MED ORDER — TRAMADOL HCL 50 MG PO TABS
50.0000 mg | ORAL_TABLET | Freq: Four times a day (QID) | ORAL | 0 refills | Status: AC | PRN
Start: 2017-04-05 — End: ?

## 2017-04-05 MED ORDER — IOPAMIDOL (ISOVUE-300) INJECTION 61%
75.0000 mL | Freq: Once | INTRAVENOUS | Status: AC | PRN
  Administered 2017-04-05: 75 mL via INTRAVENOUS

## 2017-04-05 NOTE — Discharge Instructions (Signed)
Call your pulmonologist tomorrow and get seen either tomorrow or the next day.  Bring the paperwork we give you with you to show him

## 2017-04-05 NOTE — ED Triage Notes (Addendum)
Pt reports intermittent CP and lightheadedness for the past 2 weeks. No SOB. Pt also reports she has had bilateral ankle swelling for the past 8 months, but ankles have been hurting more than usual for the past few days. No recent travel.

## 2017-04-05 NOTE — ED Notes (Addendum)
Pt reports that she has had bilateral leg swelling and reports that she had similar  Symptoms 3 months ago, and that she was tested for blood clot but was negative. Pt is c/o of left sided chest, that with position changes pain worsens.

## 2017-04-05 NOTE — ED Provider Notes (Signed)
[facility]-EMERGENCY DEPT Provider Note   CSN: [id number] Arrival date & time: 04/05/17  1132     History   Chief Complaint Chief Complaint  Patient presents with  . Chest Pain  . Joint Swelling    HPI Peggy Owens is a 47 y.o. female.  Patient has a history of sarcoid.  She states she [facility] [place] and her sarcoid kind of waxes and wanes.  Patient complains of some left-sided chest discomfort.  No fever no chills no cough no shortness of breath no night sweats   The history is provided by the patient.  Chest Pain   This is a new problem. The current episode started 1 to 2 hours ago. The problem occurs rarely. The problem has not changed since onset.The pain is associated with movement. The pain is present in the substernal region. The pain is at a severity of 3/10. The pain is moderate. The quality of the pain is described as pressure-like. Pertinent negatives include no abdominal pain, no back pain, no cough and no headaches.  Pertinent negatives for past medical history include no seizures.    Past Medical History:  Diagnosis Date  . Hypertension   . Sarcoidosis     There are no active problems to display for this patient.   Past Surgical History:  Procedure Laterality Date  . LUNG BIOPSY Left     OB History    No data available       Home Medications    Prior to Admission medications   Medication Sig Start Date End Date Taking? Authorizing Provider  ibuprofen (ADVIL,MOTRIN) 200 MG tablet Take 200 mg by mouth every 6 (six) hours as needed for moderate pain.    Yes [provider]  albuterol (PROVENTIL HFA;VENTOLIN HFA) 108 (90 Base) MCG/ACT inhaler Inhale 2 puffs into the lungs every 6 (six) hours as needed for wheezing or shortness of breath. 12/23/15   Sharyn CreamerQuale, Mark, MD  fluticasone (FLONASE) 50 MCG/ACT nasal spray Place 1 spray into both nostrils daily.    [provider]  naproxen (NAPROSYN) 500  MG tablet Take 1 tablet (500 mg total) by mouth 2 (two) times daily with a meal. Patient not taking: Reported on 04/05/2017 08/14/16 08/14/17  Phineas SemenGoodman, Graydon, MD  predniSONE (DELTASONE) 20 MG tablet Take 2 tablets (40 mg total) by mouth daily with breakfast. Patient not taking: Reported on 04/05/2017 12/23/15   Sharyn CreamerQuale, Mark, MD  traMADol (ULTRAM) 50 MG tablet Take 1 tablet (50 mg total) by mouth every 6 (six) hours as needed. 04/05/17   Bethann BerkshireZammit, Novella Abraha, MD    Family History History reviewed. No pertinent family history.  Social History Social History   Tobacco Use  . Smoking status: Never Smoker  . Smokeless tobacco: Never Used  Substance Use Topics  . Alcohol use: Yes    Comment: Occ  . Drug use: No     Allergies   Patient has no known allergies.   Review of Systems Review of Systems  Constitutional: Negative for appetite change and fatigue.  HENT: Negative for congestion, ear discharge and sinus pressure.   Eyes: Negative for discharge.  Respiratory: Negative for cough.   Cardiovascular: Positive for chest pain.  Gastrointestinal: Negative for abdominal pain and diarrhea.  Genitourinary: Negative for frequency and hematuria.  Musculoskeletal: Negative for back pain.  Skin: Negative for rash.  Neurological: Negative for seizures and headaches.  Psychiatric/Behavioral: Negative for hallucinations.     Physical Exam Updated  Vital Signs BP (!) 156/113 (BP Location: Right Arm)   Pulse 71   Temp 98.2 F (36.8 C) (Oral)   Resp 16   Ht 5\' 4"  (1.626 m)   Wt 78 kg (172 lb)   LMP 03/29/2017 (Approximate)   SpO2 99%   BMI 29.52 kg/m   Physical Exam  Constitutional: She is oriented to person, place, and time. She appears well-developed.  HENT:  Head: Normocephalic.  Eyes: Conjunctivae and EOM are normal. No scleral icterus.  Neck: Neck supple. No tracheal deviation present. No thyromegaly present.  Cardiovascular: Normal rate and regular rhythm. Exam reveals no gallop and  no friction rub.  No murmur heard. Pulmonary/Chest: No stridor. She has no wheezes. She has no rales. She exhibits no tenderness.  Abdominal: She exhibits no distension. There is no tenderness. There is no rebound.  Musculoskeletal: Normal range of motion. She exhibits no edema.  Lymphadenopathy:    She has no cervical adenopathy.  Neurological: She is oriented to person, place, and time. She exhibits normal muscle tone. Coordination normal.  Skin: Skin is warm. No rash noted. No erythema.  Psychiatric: She has a normal mood and affect. Her behavior is normal.     ED Treatments / Results  Labs (all labs ordered are listed, but only abnormal results are displayed) Labs Reviewed  BASIC METABOLIC PANEL - Abnormal; Notable for the following components:      Result Value   Glucose, Bld 109 (*)    Creatinine, Ser 1.16 (*)    GFR calc non Af Amer 56 (*)    All other components within normal limits  CBC - Abnormal; Notable for the following components:   WBC 3.7 (*)    All other components within normal limits  I-STAT TROPONIN, ED  I-STAT BETA HCG BLOOD, ED ([facility], [facility], [facility] ONLY)  I-STAT TROPONIN, ED    EKG  EKG Interpretation  Date/Time:  Wednesday April 05 2017 11:50:53 EST Ventricular Rate:  91 PR Interval:    QRS Duration: 90 QT Interval:  361 QTC Calculation: 445 R Axis:   47 Text Interpretation:  Sinus rhythm Baseline wander in lead(s) II V4 V5 V6 Confirmed by Bethann Berkshire [id number]) on 04/05/2017 7:03:04 PM       Radiology Dg Chest 2 View  Result Date: 04/05/2017 CLINICAL DATA:  Intermittent chest pain for 2 weeks. EXAM: CHEST  2 VIEW COMPARISON:  Chest x-ray dated 08/14/2016. Chest CT dated 08/14/2016. FINDINGS: Prominent interstitial markings throughout the left lung. Perhaps mildly prominent interstitial markings within the right lung. No confluent opacity to suggest consolidating pneumonia. No pleural effusion or pneumothorax seen. No acute or suspicious osseous finding.  Heart size is normal. Mild prominence of the right paratracheal mediastinum, corresponding to the mild mediastinal lymphadenopathy demonstrated on previous CT. IMPRESSION: 1. New prominent interstitial markings throughout the left lung. Perhaps mildly prominent interstitial markings within the right lung. Findings are suspicious for pulmonary manifestation of patient's known sarcoidosis. Alternative considerations include atypical pneumonias such as fungal or viral and asymmetric pulmonary edema. 2. Mild prominence of the right paratracheal mediastinum, corresponding to the mild lymphadenopathy demonstrated on earlier chest CT, again compatible with patient's known sarcoidosis. 3. Heart size is normal. Electronically Signed   By: Bary Richard M.D.   On: 04/05/2017 12:43   Ct Chest W Contrast  Result Date: 04/05/2017 CLINICAL DATA:  BILATERAL leg swelling, similar symptoms 3 months ago, LEFT side chest pain, chronic dyspnea, history hypertension, sarcoidosis EXAM: CT CHEST WITH CONTRAST TECHNIQUE:  Multidetector CT imaging of the chest was performed during intravenous contrast administration. Sagittal and coronal MPR images reconstructed from axial data set. CONTRAST:  75mL ISOVUE-300 IOPAMIDOL (ISOVUE-300) INJECTION 61% IV COMPARISON:  08/14/2016 FINDINGS: Cardiovascular: Thoracic vascular structures grossly patent on nondedicated exam. Aorta normal caliber. No pericardial effusion. Mediastinum/Nodes: Esophagus unremarkable. Base of cervical region normal appearance. Mildly enlarged RIGHT paratracheal node 13 mm short axis image 37 little changed. LEFT aerated node 11 mm short axis image 45 previously 9 mm. Additional scattered normal size mediastinal nodes. Normal sized axillary nodes. Overall, thoracic adenopathy has improved since earlier studies from 2014 and 2015. Lungs/Pleura: Innumerable micornodules throughout the LEFT upper lobe and lingula new since prior exam. Additional scattered tiny nodules are seen  in RIGHT RIGHT upper lobe, minimally in RIGHT middle and BILATERAL lower lobes. Largest discrete nodule is in the anterolateral RIGHT upper lobe 6 mm diameter subpleural image 60. Associated central peribronchial thickening. No segmental consolidation, pleural effusion or pneumothorax. Upper Abdomen: Visualized portions of the liver, pancreas, adrenal glands and upper poles of the kidneys are unremarkable. Numerous low-attenuation tiny nodular foci are present within the spleen, new. Musculoskeletal: No acute osseous findings. IMPRESSION: Innumerable tiny miliary type micronodules throughout the LEFT upper lobe, less in RIGHT upper lobe, minimally in remaining lungs, new since 08/14/2016. Associated numerous low-attenuation foci within the spleen. Differential diagnosis includes TB and fungal disease. Sarcoidosis can also demonstrate pulmonary nodular disease as well as splenic granulomata and should also be considered, particularly since patient has a history of sarcoidosis. Findings called to Dr. Estell Harpin On 04/05/2017 at 2056 hrs. Electronically Signed   By: Ulyses Southward M.D.   On: 04/05/2017 20:58    Procedures Procedures (including critical care time)  Medications Ordered in ED Medications  iopamidol (ISOVUE-300) 61 % injection (not administered)  iopamidol (ISOVUE-300) 61 % injection 75 mL (75 mLs Intravenous Contrast Given 04/05/17 2023)     Initial Impression / Assessment and Plan / ED Course  I have reviewed the triage vital signs and the nursing notes.  Pertinent labs & imaging results that were available during my care of the patient were reviewed by me and considered in my medical decision making (see chart for details).     Patient has changes on her CT exam that could be infection but most likely is her sarcoid returning.  Patient states this is happened numerous times before patient wants to follow-up with her lung doctor tomorrow to get his opinion about the treatment.  Final  Clinical Impressions(s) / ED Diagnoses   Final diagnoses:  Atypical chest pain    ED Discharge Orders        Ordered    traMADol (ULTRAM) 50 MG tablet  Every 6 hours PRN     04/05/17 2118    The patient has a history of   Bethann Berkshire, MD 04/05/17 2122

## 2018-11-21 ENCOUNTER — Other Ambulatory Visit: Payer: Self-pay

## 2018-11-21 DIAGNOSIS — Z20822 Contact with and (suspected) exposure to covid-19: Secondary | ICD-10-CM

## 2018-11-22 LAB — NOVEL CORONAVIRUS, NAA: SARS-CoV-2, NAA: NOT DETECTED

## 2020-02-11 ENCOUNTER — Other Ambulatory Visit: Payer: Self-pay

## 2020-02-11 ENCOUNTER — Emergency Department
Admission: EM | Admit: 2020-02-11 | Discharge: 2020-02-11 | Disposition: A | Discharge status: Home / Self Care | Payer: Self-pay | Attending: Emergency Medicine | Admitting: Emergency Medicine

## 2020-02-11 ENCOUNTER — Emergency Department: Payer: Self-pay

## 2020-02-11 DIAGNOSIS — I10 Essential (primary) hypertension: Secondary | ICD-10-CM | POA: Insufficient documentation

## 2020-02-11 DIAGNOSIS — R2242 Localized swelling, mass and lump, left lower limb: Secondary | ICD-10-CM | POA: Insufficient documentation

## 2020-02-11 DIAGNOSIS — G44209 Tension-type headache, unspecified, not intractable: Secondary | ICD-10-CM | POA: Insufficient documentation

## 2020-02-11 DIAGNOSIS — M25473 Effusion, unspecified ankle: Secondary | ICD-10-CM

## 2020-02-11 LAB — CBC
HCT: 36.9 % (ref 36.0–46.0)
Hemoglobin: 11.5 g/dL — ABNORMAL LOW (ref 12.0–15.0)
MCH: 24.8 pg — ABNORMAL LOW (ref 26.0–34.0)
MCHC: 31.2 g/dL (ref 30.0–36.0)
MCV: 79.7 fL — ABNORMAL LOW (ref 80.0–100.0)
Platelets: 215 10*3/uL (ref 150–400)
RBC: 4.63 MIL/uL (ref 3.87–5.11)
RDW: 17 % — ABNORMAL HIGH (ref 11.5–15.5)
WBC: 3.7 10*3/uL — ABNORMAL LOW (ref 4.0–10.5)
nRBC: 0 % (ref 0.0–0.2)

## 2020-02-11 LAB — BASIC METABOLIC PANEL
Anion gap: 9 (ref 5–15)
BUN: 13 mg/dL (ref 6–20)
CO2: 25 mmol/L (ref 22–32)
Calcium: 9.2 mg/dL (ref 8.9–10.3)
Chloride: 105 mmol/L (ref 98–111)
Creatinine, Ser: 1.11 mg/dL — ABNORMAL HIGH (ref 0.44–1.00)
GFR, Estimated: >60 mL/min (ref >60)
Glucose, Bld: 89 mg/dL (ref 70–99)
Potassium: 3.7 mmol/L (ref 3.5–5.1)
Sodium: 139 mmol/L (ref 135–145)

## 2020-02-11 MED ORDER — CLONIDINE HCL 0.1 MG PO TABS
0.1000 mg | ORAL_TABLET | Freq: Once | ORAL | Status: AC
  Administered 2020-02-11: 0.1 mg via ORAL
  Filled 2020-02-11: qty 1

## 2020-02-11 MED ORDER — HYDROCODONE-ACETAMINOPHEN 5-325 MG PO TABS
1.0000 | ORAL_TABLET | Freq: Once | ORAL | Status: AC
  Administered 2020-02-11: 1 via ORAL
  Filled 2020-02-11: qty 1

## 2020-02-11 MED ORDER — IRBESARTAN 75 MG PO TABS
75.0000 mg | ORAL_TABLET | Freq: Once | ORAL | Status: AC
  Administered 2020-02-11: 75 mg via ORAL
  Filled 2020-02-11: qty 1

## 2020-02-11 MED ORDER — VALSARTAN-HYDROCHLOROTHIAZIDE 80-12.5 MG PO TABS: 1.0000 | ORAL_TABLET | Freq: Every day | ORAL | 0 refills | Status: DC

## 2020-02-11 MED ORDER — IBUPROFEN 600 MG PO TABS
600.0000 mg | ORAL_TABLET | Freq: Once | ORAL | Status: AC
  Administered 2020-02-11: 600 mg via ORAL
  Filled 2020-02-11: qty 1

## 2020-02-11 MED ORDER — BUTALBITAL-APAP-CAFFEINE 50-325-40 MG PO TABS: 1.0000 | ORAL_TABLET | Freq: Four times a day (QID) | ORAL | 0 refills | Status: DC | PRN

## 2020-02-11 MED ORDER — CLONIDINE HCL 0.1 MG PO TABS: 0.2000 mg | ORAL_TABLET | Freq: Once | ORAL | Status: DC

## 2020-02-11 MED ORDER — BUTALBITAL-APAP-CAFFEINE 50-325-40 MG PO TABS: 1.0000 | ORAL_TABLET | Freq: Four times a day (QID) | ORAL | 0 refills | Status: AC | PRN

## 2020-02-11 NOTE — ED Provider Notes (Signed)
[facility] Emergency Department Provider Note  ____________________________________________   First MD Initiated Contact with Patient 02/11/20 1721     (approximate)  I have reviewed the triage vital signs and the nursing notes.   HISTORY  Chief Complaint Headache    HPI Peggy Owens is a 49 y.o. female with history of hypertension, sarcoidosis, here with headache and occasional ankle swelling.  The patient states that over the last several days, she has had a mild, generalized, headache.  She describes it as an ache, throbbing sensation.  It seems to be related to stressors at work and seems to be worse when she is at work.  She is also noticed some intermittent swelling in her ankles.  This also seems to get worse at work when she is sitting down, improves when she is not working or when she elevates her feet.  No actual leg pain.  Denies any focal numbness or weakness.  No photophobia or phonophobia.  No fevers or chills.  No recent illness.  Regarding her sarcoidosis, she is not currently on anything.  She has a history of pulmonary involvement but denies any cough, shortness of breath, or other symptoms concerning for active sarcoidosis flare.        Past Medical History:  Diagnosis Date  . Hypertension   . Sarcoidosis     There are no problems to display for this patient.   Past Surgical History:  Procedure Laterality Date  . LUNG BIOPSY Left     Prior to Admission medications   Medication Sig Start Date End Date Taking? Authorizing Provider  albuterol (PROVENTIL HFA;VENTOLIN HFA) 108 (90 Base) MCG/ACT inhaler Inhale 2 puffs into the lungs every 6 (six) hours as needed for wheezing or shortness of breath. 12/23/15   Sharyn Creamer, MD  butalbital-acetaminophen-caffeine (FIORICET) [phone] MG tablet Take 1 tablet by mouth every 6 (six) hours as needed for headache. 02/11/20 02/10/21  Shaune Pollack, MD  fluticasone (FLONASE) 50 MCG/ACT nasal  spray Place 1 spray into both nostrils daily.    [provider]  ibuprofen (ADVIL,MOTRIN) 200 MG tablet Take 200 mg by mouth every 6 (six) hours as needed for moderate pain.     [provider]  predniSONE (DELTASONE) 20 MG tablet Take 2 tablets (40 mg total) by mouth daily with breakfast. Patient not taking: Reported on 04/05/2017 12/23/15   Sharyn Creamer, MD  traMADol (ULTRAM) 50 MG tablet Take 1 tablet (50 mg total) by mouth every 6 (six) hours as needed. 04/05/17   Bethann Berkshire, MD  valsartan-hydrochlorothiazide (DIOVAN HCT) 80-12.5 MG tablet Take 1 tablet by mouth daily. 02/11/20 03/12/20  Shaune Pollack, MD    Allergies Patient has no known allergies.  No family history on file.  Social History Social History   Tobacco Use  . Smoking status: Never Smoker  . Smokeless tobacco: Never Used  Substance Use Topics  . Alcohol use: Yes    Comment: Occ  . Drug use: No    Review of Systems  Review of Systems  Constitutional: Positive for fatigue. Negative for fever.  HENT: Negative for congestion and sore throat.   Eyes: Negative for visual disturbance.  Respiratory: Negative for cough and shortness of breath.   Cardiovascular: Positive for leg swelling. Negative for chest pain.  Gastrointestinal: Negative for abdominal pain, diarrhea, nausea and vomiting.  Genitourinary: Negative for flank pain.  Musculoskeletal: Negative for back pain and neck pain.  Skin: Negative for rash and wound.  Neurological: Positive  for headaches. Negative for weakness.  All other systems reviewed and are negative.    ____________________________________________  PHYSICAL EXAM:      VITAL SIGNS: ED Triage Vitals  Enc Vitals Group     BP 02/11/20 1447 (!) 190/117     Pulse Rate 02/11/20 1447 71     Resp 02/11/20 1447 19     Temp 02/11/20 1447 98.4 F (36.9 C)     Temp Source 02/11/20 1447 Oral     SpO2 02/11/20 1447 99 %     Weight --      Height --      Head Circumference --       Peak Flow --      Pain Score 02/11/20 1518 7     Pain Loc --      Pain Edu? --      Excl. in GC? --      Physical Exam Vitals and nursing note reviewed.  Constitutional:      General: She is not in acute distress.    Appearance: She is well-developed.  HENT:     Head: Normocephalic and atraumatic.  Eyes:     Conjunctiva/sclera: Conjunctivae normal.  Cardiovascular:     Rate and Rhythm: Normal rate and regular rhythm.     Heart sounds: Normal heart sounds. No murmur heard.  No friction rub.  Pulmonary:     Effort: Pulmonary effort is normal. No respiratory distress.     Breath sounds: Normal breath sounds. No wheezing or rales.  Abdominal:     General: There is no distension.     Palpations: Abdomen is soft.     Tenderness: There is no abdominal tenderness.  Musculoskeletal:     Cervical back: Neck supple.  Skin:    General: Skin is warm.     Capillary Refill: Capillary refill takes less than 2 seconds.  Neurological:     Mental Status: She is alert and oriented to person, place, and time.     Motor: No abnormal muscle tone.     Comments: Neurological Exam:  Mental Status: Alert and oriented to person, place, and time. Attention and concentration normal. Speech clear. Recent memory is intact. Cranial Nerves: Visual fields grossly intact. EOMI and PERRLA. No nystagmus noted. Facial sensation intact at forehead, maxillary cheek, and chin/mandible bilaterally. No facial asymmetry or weakness. Hearing grossly normal. Uvula is midline, and palate elevates symmetrically. Normal SCM and trapezius strength. Tongue midline without fasciculations. Motor: Muscle strength 5/5 in proximal and distal UE and LE bilaterally. No pronator drift. Muscle tone normal.  Sensation: Intact to light touch in upper and lower extremities distally bilaterally.  Gait: Normal without ataxia. Coordination: Normal FTN bilaterally.          ____________________________________________    LABS (all labs ordered are listed, but only abnormal results are displayed)  Labs Reviewed  CBC - Abnormal; Notable for the following components:      Result Value   WBC 3.7 (*)    Hemoglobin 11.5 (*)    MCV 79.7 (*)    MCH 24.8 (*)    RDW 17.0 (*)    All other components within normal limits  BASIC METABOLIC PANEL - Abnormal; Notable for the following components:   Creatinine, Ser 1.11 (*)    All other components within normal limits    ____________________________________________  EKG: Normal sinus rhythm, ventricular rate 79.  PR 158, QRS 84, QTc 416.  No acute ST elevations or depressions. ________________________________________  RADIOLOGY All imaging, including plain films, CT scans, and ultrasounds, independently reviewed by me, and interpretations confirmed via formal radiology reads.  ED MD interpretation:     Official radiology report(s): No results found.  ____________________________________________  PROCEDURES   Procedure(s) performed (including Critical Care):  Procedures  ____________________________________________  INITIAL IMPRESSION / MDM / ASSESSMENT AND PLAN / ED COURSE  As part of my medical decision making, I reviewed the following data within the electronic MEDICAL RECORD NUMBER Nursing notes reviewed and incorporated, Old chart reviewed, Notes from prior ED visits, and [place] Controlled Substance Database       *Kinnedy Mongiello was evaluated in Emergency Department on 02/11/2020 for the symptoms described in the history of present illness. She was evaluated in the context of the global COVID-19 pandemic, which necessitated consideration that the patient might be at risk for infection with the SARS-CoV-2 virus that causes COVID-19. Institutional protocols and algorithms that pertain to the evaluation of patients at risk for COVID-19 are in a state of rapid change based on information released by regulatory bodies including the CDC and federal and state  organizations. These policies and algorithms were followed during the patient's care in the ED.  Some ED evaluations and interventions may be delayed as a result of limited staffing during the pandemic.*     Medical Decision Making: 49 year old female here with mild generalized headache, hypertension, and intermittent ankle swelling.  I suspect symptoms are due to poorly controlled hypertension, likely due to not taking any medications, along with underlying hypertension as well as sarcoidosis.  Regarding her headache, no red flags.  No focal numbness or weakness.  Headache began gradually and is not concern for subarachnoid.  No fever, chills, or signs of meningitis or encephalitis.  Will treat with symptomatic care and blood pressure control.  Otherwise, her lab work is overall reassuring.  She has a slight creatinine elevation which I suspect is chronic.  She was previously on valsartan/HCTZ, which I think is reasonable for both renal protection as well as her ankle swelling and underlying sarcoidosis.  Regarding her sarcoidosis, refer to a PCP.  No evidence of active flare.  Return precautions given.  ____________________________________________  FINAL CLINICAL IMPRESSION(S) / ED DIAGNOSES  Final diagnoses:  Primary hypertension  Ankle swelling, unspecified laterality  Tension headache     MEDICATIONS GIVEN DURING THIS VISIT:  Medications  HYDROcodone-acetaminophen (NORCO/VICODIN) 5-325 MG per tablet 1 tablet (1 tablet Oral Given 02/11/20 1802)  ibuprofen (ADVIL) tablet 600 mg (600 mg Oral Given 02/11/20 1802)     ED Discharge Orders         Ordered    valsartan-hydrochlorothiazide (DIOVAN HCT) 80-12.5 MG tablet  Daily        02/11/20 1829    butalbital-acetaminophen-caffeine (FIORICET) 50-325-40 MG tablet  Every 6 hours PRN        02/11/20 1829           Note:  This document was prepared using Dragon voice recognition software and may include unintentional dictation errors.     Shaune Pollack, MD 02/11/20 [phone]

## 2020-02-11 NOTE — ED Notes (Addendum)
Patient states she has had a headache for four days with swollen ankles.

## 2020-02-11 NOTE — ED Notes (Signed)
Discharge signature pad not working  

## 2020-02-11 NOTE — ED Triage Notes (Signed)
Pt comes via POV from home with c/o headache for 4 days. Pt denies any N/V, dizziness or blurred vision. Pt states no relief with medication.

## 2020-02-11 NOTE — Discharge Instructions (Addendum)
Call the [facility] or [facility] for follow-up  Take the blood pressure medication as prescribed

## 2020-02-11 NOTE — ED Notes (Signed)
Family at bedside. 

## 2020-05-31 ENCOUNTER — Other Ambulatory Visit: Payer: Self-pay

## 2020-05-31 ENCOUNTER — Emergency Department
Admission: EM | Admit: 2020-05-31 | Discharge: 2020-05-31 | Disposition: A | Discharge status: Home / Self Care | Payer: Self-pay | Attending: Emergency Medicine | Admitting: Emergency Medicine

## 2020-05-31 DIAGNOSIS — I10 Essential (primary) hypertension: Secondary | ICD-10-CM | POA: Insufficient documentation

## 2020-05-31 DIAGNOSIS — Z79899 Other long term (current) drug therapy: Secondary | ICD-10-CM | POA: Insufficient documentation

## 2020-05-31 DIAGNOSIS — T40711A Poisoning by cannabis, accidental (unintentional), initial encounter: Secondary | ICD-10-CM | POA: Insufficient documentation

## 2020-05-31 LAB — BASIC METABOLIC PANEL
Anion gap: 9 (ref 5–15)
BUN: 12 mg/dL (ref 6–20)
CO2: 20 mmol/L — ABNORMAL LOW (ref 22–32)
Calcium: 8.4 mg/dL — ABNORMAL LOW (ref 8.9–10.3)
Chloride: 103 mmol/L (ref 98–111)
Creatinine, Ser: 1.22 mg/dL — ABNORMAL HIGH (ref 0.44–1.00)
GFR, Estimated: 55 mL/min — ABNORMAL LOW (ref >60)
Glucose, Bld: 155 mg/dL — ABNORMAL HIGH (ref 70–99)
Potassium: 3.2 mmol/L — ABNORMAL LOW (ref 3.5–5.1)
Sodium: 132 mmol/L — ABNORMAL LOW (ref 135–145)

## 2020-05-31 LAB — URINE DRUG SCREEN, QUALITATIVE (ARMC ONLY)
Amphetamines, Ur Screen: NOT DETECTED
Barbiturates, Ur Screen: NOT DETECTED
Benzodiazepine, Ur Scrn: NOT DETECTED
Cannabinoid 50 Ng, Ur ~~LOC~~: POSITIVE — AB
Cocaine Metabolite,Ur ~~LOC~~: NOT DETECTED
MDMA (Ecstasy)Ur Screen: NOT DETECTED
Methadone Scn, Ur: NOT DETECTED
Opiate, Ur Screen: NOT DETECTED
Phencyclidine (PCP) Ur S: NOT DETECTED
Tricyclic, Ur Screen: NOT DETECTED

## 2020-05-31 LAB — URINALYSIS, COMPLETE (UACMP) WITH MICROSCOPIC
Bacteria, UA: NONE SEEN
Bilirubin Urine: NEGATIVE
Glucose, UA: NEGATIVE mg/dL
Hgb urine dipstick: NEGATIVE
Ketones, ur: NEGATIVE mg/dL
Leukocytes,Ua: NEGATIVE
Nitrite: NEGATIVE
Protein, ur: NEGATIVE mg/dL
Specific Gravity, Urine: 1.012 (ref 1.005–1.030)
pH: 5 (ref 5.0–8.0)

## 2020-05-31 LAB — CBC
HCT: 37 % (ref 36.0–46.0)
Hemoglobin: 11.7 g/dL — ABNORMAL LOW (ref 12.0–15.0)
MCH: 27.3 pg (ref 26.0–34.0)
MCHC: 31.6 g/dL (ref 30.0–36.0)
MCV: 86.2 fL (ref 80.0–100.0)
Platelets: 240 10*3/uL (ref 150–400)
RBC: 4.29 MIL/uL (ref 3.87–5.11)
RDW: 14.4 % (ref 11.5–15.5)
WBC: 5.6 10*3/uL (ref 4.0–10.5)
nRBC: 0 % (ref 0.0–0.2)

## 2020-05-31 LAB — POC URINE PREG, ED: Preg Test, Ur: NEGATIVE

## 2020-05-31 MED ORDER — SODIUM CHLORIDE 0.9 % IV BOLUS (SEPSIS)
1000.0000 mL | Freq: Once | INTRAVENOUS | Status: AC
  Administered 2020-05-31: 1000 mL via INTRAVENOUS

## 2020-05-31 MED ORDER — LORAZEPAM 2 MG/ML IJ SOLN
1.0000 mg | Freq: Once | INTRAMUSCULAR | Status: AC
  Administered 2020-05-31: 1 mg via INTRAVENOUS
  Filled 2020-05-31: qty 1

## 2020-05-31 NOTE — ED Notes (Signed)
Pt ambulatory to the restroom with a steady gait.

## 2020-05-31 NOTE — ED Provider Notes (Signed)
[facility] Emergency Department Provider Note  ____________________________________________   Event Date/Time   First MD Initiated Contact with Patient 05/31/20 [phone]     (approximate)  I have reviewed the triage vital signs and the nursing notes.   HISTORY  Chief Complaint Ingestion and Near Syncope    HPI Peggy Owens is a 50 y.o. female with hypertension, sarcoidosis who presents to the emergency department with feeling lightheaded and feels like her heart is racing and like she might pass out after eating a THC gummy and drinking alcohol around 10 PM tonight.  States she was not trying to harm herself.  She has never had a THC gummy before.  She feels like her palpitations have resolved but she is still feeling lightheaded and abnormal.  No vomiting or diarrhea.  No chest pain or shortness of breath.        Past Medical History:  Diagnosis Date  . Hypertension   . Sarcoidosis     There are no problems to display for this patient.   Past Surgical History:  Procedure Laterality Date  . LUNG BIOPSY Left     Prior to Admission medications   Medication Sig Start Date End Date Taking? Authorizing Provider  albuterol (PROVENTIL HFA;VENTOLIN HFA) 108 (90 Base) MCG/ACT inhaler Inhale 2 puffs into the lungs every 6 (six) hours as needed for wheezing or shortness of breath. 12/23/15   Sharyn Creamer, MD  butalbital-acetaminophen-caffeine (FIORICET) [phone] MG tablet Take 1 tablet by mouth every 6 (six) hours as needed for headache. 02/11/20 02/10/21  Shaune Pollack, MD  fluticasone (FLONASE) 50 MCG/ACT nasal spray Place 1 spray into both nostrils daily.    [provider]  ibuprofen (ADVIL,MOTRIN) 200 MG tablet Take 200 mg by mouth every 6 (six) hours as needed for moderate pain.     [provider]  predniSONE (DELTASONE) 20 MG tablet Take 2 tablets (40 mg total) by mouth daily with breakfast. Patient not taking: Reported on 04/05/2017  12/23/15   Sharyn Creamer, MD  traMADol (ULTRAM) 50 MG tablet Take 1 tablet (50 mg total) by mouth every 6 (six) hours as needed. 04/05/17   Bethann Berkshire, MD  valsartan-hydrochlorothiazide (DIOVAN HCT) 80-12.5 MG tablet Take 1 tablet by mouth daily. 02/11/20 03/12/20  Shaune Pollack, MD    Allergies Patient has no known allergies.  No family history on file.  Social History Social History   Tobacco Use  . Smoking status: Never Smoker  . Smokeless tobacco: Never Used  Substance Use Topics  . Alcohol use: Yes    Comment: Occ  . Drug use: Yes    Comment: CBD gummies    Review of Systems Constitutional: No fever. Eyes: No visual changes. ENT: No sore throat. Cardiovascular: Denies chest pain. Respiratory: Denies shortness of breath. Gastrointestinal: No nausea, vomiting, diarrhea. Genitourinary: Negative for dysuria. Musculoskeletal: Negative for back pain. Skin: Negative for rash. Neurological: Negative for focal weakness or numbness.  ____________________________________________   PHYSICAL EXAM:  VITAL SIGNS: ED Triage Vitals  Enc Vitals Group     BP 05/31/20 0049 (!) 170/106     Pulse Rate 05/31/20 0049 (!) 116     Resp 05/31/20 0049 20     Temp 05/31/20 0049 97.8 F (36.6 C)     Temp Source 05/31/20 0049 Oral     SpO2 05/31/20 0049 99 %     Weight 05/31/20 0055 180 lb (81.6 kg)     Height 05/31/20 0055 5\' 4"  (1.626 m)  Head Circumference --      Peak Flow --      Pain Score 05/31/20 0055 0     Pain Loc --      Pain Edu? --      Excl. in GC? --    CONSTITUTIONAL: Alert and oriented and responds appropriately to questions. Well-appearing; well-nourished HEAD: Normocephalic EYES: Conjunctivae clear, pupils appear equal, EOM appear intact ENT: normal nose; moist mucous membranes NECK: Supple, normal ROM CARD: Giller and tachycardic; S1 and S2 appreciated; no murmurs, no clicks, no rubs, no gallops RESP: Normal chest excursion without splinting or tachypnea;  breath sounds clear and equal bilaterally; no wheezes, no rhonchi, no rales, no hypoxia or respiratory distress, speaking full sentences ABD/GI: Normal bowel sounds; non-distended; soft, non-tender, no rebound, no guarding, no peritoneal signs, no hepatosplenomegaly BACK: The back appears normal EXT: Normal ROM in all joints; no deformity noted, no edema; no cyanosis SKIN: Normal color for age and race; warm; no rash on exposed skin NEURO: Moves all extremities equally, no facial asymmetry, normal speech, normal gait PSYCH: The patient's mood and manner are appropriate.  Denies SI or HI.  ____________________________________________   LABS (all labs ordered are listed, but only abnormal results are displayed)  Labs Reviewed  BASIC METABOLIC PANEL - Abnormal; Notable for the following components:      Result Value   Sodium 132 (*)    Potassium 3.2 (*)    CO2 20 (*)    Glucose, Bld 155 (*)    Creatinine, Ser 1.22 (*)    Calcium 8.4 (*)    GFR, Estimated 55 (*)    All other components within normal limits  CBC - Abnormal; Notable for the following components:   Hemoglobin 11.7 (*)    All other components within normal limits  URINALYSIS, COMPLETE (UACMP) WITH MICROSCOPIC - Abnormal; Notable for the following components:   Color, Urine STRAW (*)    APPearance CLEAR (*)    All other components within normal limits  URINE DRUG SCREEN, QUALITATIVE ([facility] ONLY) - Abnormal; Notable for the following components:   Cannabinoid 50 Ng, Ur [place] POSITIVE (*)    All other components within normal limits  POC URINE PREG, ED  CBG MONITORING, ED   ____________________________________________  EKG   EKG Interpretation  Date/Time:  Sunday May 31 2020 00:51:38 EST Ventricular Rate:  110 PR Interval:  174 QRS Duration: 88 QT Interval:  348 QTC Calculation: 470 R Axis:   26 Text Interpretation: Sinus tachycardia Cannot rule out Anterior infarct , age undetermined Abnormal ECG Confirmed by  Rochele Raring [phone]) on 05/31/2020 3:46:39 AM       ____________________________________________  RADIOLOGY Normajean Baxter Gentry Pilson, personally viewed and evaluated these images (plain radiographs) as part of my medical decision making, as well as reviewing the written report by the radiologist.  ED MD interpretation:  none  Official radiology report(s): No results found.  ____________________________________________   PROCEDURES  Procedure(s) performed (including Critical Care):  Procedures   ____________________________________________   INITIAL IMPRESSION / ASSESSMENT AND PLAN / ED COURSE  As part of my medical decision making, I reviewed the following data within the electronic MEDICAL RECORD NUMBER Nursing notes reviewed and incorporated, Labs reviewed , EKG interpreted tachycardia, Old EKG reviewed and Notes from prior ED visits         Patient here with dizziness, palpitations and near syncope after ingesting a THC gummy and drinking alcohol tonight.  She is tachycardic but otherwise hemodynamically stable and  EKG shows no arrhythmia, or interval abnormality or ischemia.  Labs here are unremarkable.  Will give IV fluids, Ativan and reassess.  She denies that this was an attempt to harm herself.    ED PROGRESS  Patient reports some improvement in symptoms.  Her heart rate has improved.  I feel she is safe to be discharged home with a sober driver in the morning.  Recommended that she avoid CBD Gummies in the future.  Patient verbalized understanding.  At this time, I do not feel there is any life-threatening condition present. I have reviewed, interpreted and discussed all results (EKG, imaging, lab, urine as appropriate) and exam findings with patient/family. I have reviewed nursing notes and appropriate previous records.  I feel the patient is safe to be discharged home without further emergent workup and can continue workup as an outpatient as needed. Discussed usual and  customary return precautions. Patient/family verbalize understanding and are comfortable with this plan.  Outpatient follow-up has been provided as needed. All questions have been answered.  ____________________________________________   FINAL CLINICAL IMPRESSION(S) / ED DIAGNOSES  Final diagnoses:  Cannabis overdose, accidental or unintentional, initial encounter     ED Discharge Orders    None      *Please note:  Peggy Owens was evaluated in Emergency Department on 05/31/2020 for the symptoms described in the history of present illness. She was evaluated in the context of the global COVID-19 pandemic, which necessitated consideration that the patient might be at risk for infection with the SARS-CoV-2 virus that causes COVID-19. Institutional protocols and algorithms that pertain to the evaluation of patients at risk for COVID-19 are in a state of rapid change based on information released by regulatory bodies including the CDC and federal and state organizations. These policies and algorithms were followed during the patient's care in the ED.  Some ED evaluations and interventions may be delayed as a result of limited staffing during and the pandemic.*   Note:  This document was prepared using Dragon voice recognition software and may include unintentional dictation errors.   Peggy Owens, Layla Maw, DO 05/31/20 [phone]

## 2020-05-31 NOTE — ED Notes (Signed)
Pt texting her daughter at this time. Pt states that daughter lives about 20 mins away.

## 2020-05-31 NOTE — ED Notes (Signed)
Per pt, she ate CBG gummy around 10pm and started feeling lightheaded and dizzy. Pt denies having CBD gummies before and does admit to drinking etoh tonight.   Pt appears in NAD at this time.

## 2020-05-31 NOTE — Discharge Instructions (Addendum)
Steps to find a Primary Care Provider (PCP):  Call [phone] or [phone] to access "[facility] Find a Doctor Service."  2.  You may also go on the  website at [email or web address]  

## 2020-05-31 NOTE — ED Notes (Signed)
Attempted to call pt's daughter Karie Georges, call went straight to voicemail. Will continue to attempt to call a ride.

## 2020-05-31 NOTE — ED Notes (Signed)
Pt accompanied by her husband was picked up by pt's daughter.

## 2020-05-31 NOTE — ED Triage Notes (Signed)
Pt arrived via [facility] for ingestion of 150mg  CBD gummy . Pt reports hx of HTN but does not take it everyday. Pt states she also had a couple shots of whiskey. Pt a&o x 4 responses appropriate but slow. Pt reported feeling lightheaded, dizzy, palpitations, feels "like im blacking out, but I dont know".  Pt denies any pain.   Ems vitals  146/100 HR 110 cbg 136

## 2020-11-30 ENCOUNTER — Other Ambulatory Visit: Payer: Self-pay

## 2020-11-30 ENCOUNTER — Ambulatory Visit
Admission: EM | Admit: 2020-11-30 | Discharge: 2020-11-30 | Disposition: A | Discharge status: Home / Self Care | Payer: Self-pay | Attending: Emergency Medicine | Admitting: Emergency Medicine

## 2020-11-30 DIAGNOSIS — I10 Essential (primary) hypertension: Secondary | ICD-10-CM

## 2020-11-30 DIAGNOSIS — L819 Disorder of pigmentation, unspecified: Secondary | ICD-10-CM

## 2020-11-30 MED ORDER — VALSARTAN-HYDROCHLOROTHIAZIDE 80-12.5 MG PO TABS: 1.0000 | ORAL_TABLET | Freq: Every day | ORAL | 0 refills | Status: AC

## 2020-11-30 MED ORDER — PREDNISONE 10 MG PO TABS: 10.0000 mg | ORAL_TABLET | Freq: Every day | ORAL | 0 refills | Status: AC

## 2020-11-30 NOTE — Discharge Instructions (Addendum)
Tylenol and ibuprofen as needed Continue to move and use hand, apply heat Restart valsartan and prednisone  IMPORTANT PATIENT INSTRUCTIONS FOR ULTRASOUND:  You have been scheduled for an Outpatient Vascular Study at [facility].    If tomorrow is a Saturday, Sunday or holiday, please go to the [facility] Emergency Department Registration Desk at 11 am tomorrow morning and tell them you are there for a vascular study.   If tomorrow is a weekday (Monday-Friday), please go to [facility] Registration at 11 am and tell them you are there for a vascular study.

## 2020-11-30 NOTE — ED Triage Notes (Signed)
Pt states her left hand turned purple today, states on Saturday she was washing dishes and a fork felt like it cut her but didn't on her left pointer finger, area is now purple per patient  .

## 2020-12-01 NOTE — ED Provider Notes (Signed)
[facility]    CSN: [id number] Arrival date & time: 11/30/20  1254      History   Chief Complaint Chief Complaint  Patient presents with   discoloration of hand     HPI Zelia Yzaguirre is a 50 y.o. female history of hypertension presenting today for evaluation of discoloration of left hand.  Reports that she noticed a purple discoloration to her fingers and palm of hand today.  Denies any associated pain.  Reports discoloration has slightly improved since initially noticing.  Denies associated pain.  Denies any notable injury or trauma, but does report prior to onset she did accidentally poke her index finger with the prong of a fork which caused initial pain and swelling.  Since she has had some continued soreness to PIP of left index finger.  Denies history of any circulation problems.  Denies any pain in forearm or upper arm.  Denies history of DVT/PE.  Denies tobacco use.  Patient does report her blood pressure is elevated has been out of her valsartan.  She also reports being off prednisone daily which she takes for sarcoidosis.  Is currently in between specialist and PCPs as she currently does not have insurance.  HPI  Past Medical History:  Diagnosis Date   Hypertension    Sarcoidosis     There are no problems to display for this patient.   Past Surgical History:  Procedure Laterality Date   LUNG BIOPSY Left     OB History   No obstetric history on file.      Home Medications    Prior to Admission medications   Medication Sig Start Date End Date Taking? Authorizing Provider  albuterol (PROVENTIL HFA;VENTOLIN HFA) 108 (90 Base) MCG/ACT inhaler Inhale 2 puffs into the lungs every 6 (six) hours as needed for wheezing or shortness of breath. 12/23/15   Sharyn Creamer, MD  butalbital-acetaminophen-caffeine (FIORICET) [phone] MG tablet Take 1 tablet by mouth every 6 (six) hours as needed for headache. 02/11/20 02/10/21  Shaune Pollack, MD  fluticasone (FLONASE)  50 MCG/ACT nasal spray Place 1 spray into both nostrils daily.    [provider]  ibuprofen (ADVIL,MOTRIN) 200 MG tablet Take 200 mg by mouth every 6 (six) hours as needed for moderate pain.     [provider]  predniSONE (DELTASONE) 10 MG tablet Take 1 tablet (10 mg total) by mouth daily with breakfast. 11/30/20   Hilberto Burzynski C, PA-C  traMADol (ULTRAM) 50 MG tablet Take 1 tablet (50 mg total) by mouth every 6 (six) hours as needed. 04/05/17   Bethann Berkshire, MD  valsartan-hydrochlorothiazide (DIOVAN HCT) 80-12.5 MG tablet Take 1 tablet by mouth daily. 11/30/20 02/28/21  Ollin Hochmuth, Junius Creamer, PA-C    Family History History reviewed. No pertinent family history.  Social History Social History   Tobacco Use   Smoking status: Never   Smokeless tobacco: Never  Substance Use Topics   Alcohol use: Yes    Comment: Occ   Drug use: Yes    Comment: CBD gummies     Allergies   Patient has no known allergies.   Review of Systems Review of Systems  Constitutional:  Negative for fatigue and fever.  HENT:  Negative for mouth sores.   Eyes:  Negative for visual disturbance.  Respiratory:  Negative for shortness of breath.   Cardiovascular:  Negative for chest pain.  Gastrointestinal:  Negative for abdominal pain, nausea and vomiting.  Genitourinary:  Negative for genital sores.  Musculoskeletal:  Negative for arthralgias and joint swelling.  Skin:  Positive for color change. Negative for rash and wound.  Neurological:  Negative for dizziness, weakness, light-headedness and headaches.    Physical Exam Triage Vital Signs ED Triage Vitals [11/30/20 1440]  Enc Vitals Group     BP (!) 177/126     Pulse Rate 88     Resp 18     Temp 98.1 F (36.7 C)     Temp Source Oral     SpO2 98 %     Weight      Height      Head Circumference      Peak Flow      Pain Score 0     Pain Loc      Pain Edu?      Excl. in GC?    No data found.  Updated Vital Signs BP (!)  177/126 (BP Location: Right Arm)   Pulse 88   Temp 98.1 F (36.7 C) (Oral)   Resp 18   LMP 10/18/2020   SpO2 98%   Visual Acuity Right Eye Distance:   Left Eye Distance:   Bilateral Distance:    Right Eye Near:   Left Eye Near:    Bilateral Near:     Physical Exam Vitals and nursing note reviewed.  Constitutional:      Appearance: She is well-developed.     Comments: No acute distress  HENT:     Head: Normocephalic and atraumatic.     Nose: Nose normal.  Eyes:     Conjunctiva/sclera: Conjunctivae normal.  Cardiovascular:     Rate and Rhythm: Normal rate.  Pulmonary:     Effort: Pulmonary effort is normal. No respiratory distress.  Abdominal:     General: There is no distension.  Musculoskeletal:        General: Normal range of motion.     Cervical back: Neck supple.  Skin:    General: Skin is warm and dry.     Comments: Left hand with notables purple discoloration noted to middle and distal phalanxes and thenar eminence on palmar aspect, cap refill brisk, no associated tenderness, full active range of motion of all 5 fingers, radial pulse 2+, no swelling or erythema noted proximally  Neurological:     Mental Status: She is alert and oriented to person, place, and time.     UC Treatments / Results  Labs (all labs ordered are listed, but only abnormal results are displayed) Labs Reviewed - No data to display  EKG   Radiology No results found.  Procedures Procedures (including critical care time)  Medications Ordered in UC Medications - No data to display  Initial Impression / Assessment and Plan / UC Course  I have reviewed the triage vital signs and the nursing notes.  Pertinent labs & imaging results that were available during my care of the patient were reviewed by me and considered in my medical decision making (see chart for details).     Unclear cause of discoloration of fingers, cap refill/circulation appears normal, no associated pain,  symptoms involving all 5 fingers and not isolated to finger with possible injury.  Low suspicion of underlying DVT, but may consider ultrasound to rule out if symptoms persistent.  Ultrasound order placed, in the interim recommended anti-inflammatories heat and close monitoring.  Restarting patient on valsartan as well as daily prednisone in case this at all could be playing a role as blood pressure is high today.  Discussed  warning signs with patient related to elevated blood pressure.  Discussed strict return precautions. Patient verbalized understanding and is agreeable with plan.    Final Clinical Impressions(s) / UC Diagnoses   Final diagnoses:  Discoloration of skin of hand  Essential hypertension     Discharge Instructions      Tylenol and ibuprofen as needed Continue to move and use hand, apply heat Restart valsartan and prednisone  IMPORTANT PATIENT INSTRUCTIONS FOR ULTRASOUND:  You have been scheduled for an Outpatient Vascular Study at [facility].    If tomorrow is a Saturday, Sunday or holiday, please go to the [facility] Emergency Department Registration Desk at 11 am tomorrow morning and tell them you are there for a vascular study.   If tomorrow is a weekday (Monday-Friday), please go to [facility] Registration at 11 am and tell them you are there for a vascular study.      ED Prescriptions     Medication Sig Dispense Auth. Provider   valsartan-hydrochlorothiazide (DIOVAN HCT) 80-12.5 MG tablet Take 1 tablet by mouth daily. 90 tablet Charlei Ramsaran C, PA-C   predniSONE (DELTASONE) 10 MG tablet Take 1 tablet (10 mg total) by mouth daily with breakfast. 30 tablet Javier Gell C, PA-C      PDMP not reviewed this encounter.   Lew Dawes, [place] 12/01/20 [phone]

## 2021-04-22 ENCOUNTER — Other Ambulatory Visit: Payer: Self-pay

## 2021-04-22 ENCOUNTER — Emergency Department
Admission: EM | Admit: 2021-04-22 | Discharge: 2021-04-22 | Disposition: A | Discharge status: Home / Self Care | Payer: Self-pay | Attending: Emergency Medicine | Admitting: Emergency Medicine

## 2021-04-22 ENCOUNTER — Emergency Department: Payer: Self-pay

## 2021-04-22 DIAGNOSIS — I10 Essential (primary) hypertension: Secondary | ICD-10-CM | POA: Insufficient documentation

## 2021-04-22 DIAGNOSIS — R079 Chest pain, unspecified: Secondary | ICD-10-CM

## 2021-04-22 LAB — BASIC METABOLIC PANEL
Anion gap: 7 (ref 5–15)
BUN: 9 mg/dL (ref 6–20)
CO2: 20 mmol/L — ABNORMAL LOW (ref 22–32)
Calcium: 9 mg/dL (ref 8.9–10.3)
Chloride: 109 mmol/L (ref 98–111)
Creatinine, Ser: 0.97 mg/dL (ref 0.44–1.00)
GFR, Estimated: >60 mL/min (ref >60)
Glucose, Bld: 112 mg/dL — ABNORMAL HIGH (ref 70–99)
Potassium: 4 mmol/L (ref 3.5–5.1)
Sodium: 136 mmol/L (ref 135–145)

## 2021-04-22 LAB — CBC
HCT: 41.8 % (ref 36.0–46.0)
Hemoglobin: 13.6 g/dL (ref 12.0–15.0)
MCH: 28.2 pg (ref 26.0–34.0)
MCHC: 32.5 g/dL (ref 30.0–36.0)
MCV: 86.7 fL (ref 80.0–100.0)
Platelets: 239 10*3/uL (ref 150–400)
RBC: 4.82 MIL/uL (ref 3.87–5.11)
RDW: 14 % (ref 11.5–15.5)
WBC: 5 10*3/uL (ref 4.0–10.5)
nRBC: 0 % (ref 0.0–0.2)

## 2021-04-22 LAB — TROPONIN I (HIGH SENSITIVITY): Troponin I (High Sensitivity): 3 ng/L (ref <18)

## 2021-04-22 MED ORDER — IRBESARTAN 75 MG PO TABS
75.0000 mg | ORAL_TABLET | Freq: Every day | ORAL | Status: DC
  Administered 2021-04-22: 75 mg via ORAL
  Filled 2021-04-22: qty 1

## 2021-04-22 MED ORDER — VALSARTAN-HYDROCHLOROTHIAZIDE 80-12.5 MG PO TABS: 1.0000 | ORAL_TABLET | ORAL | Status: DC

## 2021-04-22 MED ORDER — HYDROCHLOROTHIAZIDE 12.5 MG PO TABS
12.5000 mg | ORAL_TABLET | Freq: Every day | ORAL | Status: DC
  Administered 2021-04-22: 12.5 mg via ORAL
  Filled 2021-04-22: qty 1

## 2021-04-22 NOTE — ED Notes (Signed)
Called pt to room-no answer 

## 2021-04-22 NOTE — ED Notes (Signed)
Pt c/o L sided CP, onset this AM. Pt states she has a hx of sarcoidosis & PNA & it kind of feels like that, but she is not sure. Pain relieved when pt is not moving. Denies SHOB.

## 2021-04-22 NOTE — ED Provider Notes (Signed)
[facility] Provider Note    Event Date/Time   First MD Initiated Contact with Patient 04/22/21 1231     (approximate)   History   Chest Pain   HPI  Peggy Owens is a 51 y.o. female with past medical history of sarcoidosis and hypertension followed by PCP taking BP meds prescribed during previous ED visit who presents for assessment of some left-sided chest pain that started this morning.  Patient states she first felt it when she woke up.  It is reproducible when she pushes on the left side of her chest over the second.  In her rib space.  She states she felt little short of breath with it.  No cough, fevers, right-sided pain, back pain, nausea, vomiting, diarrhea, rash, urinary symptoms or any other acute sick symptoms.  She denies EtOH use illicit drug use or any recent traumatic injuries or falls.  No other acute concerns at this time.  She states she is compliant with her blood pressure medicines.      Physical Exam  Triage Vital Signs: ED Triage Vitals  Enc Vitals Group     BP 04/22/21 1126 (!) 185/121     Pulse Rate 04/22/21 1126 92     Resp 04/22/21 1126 20     Temp 04/22/21 1126 98.4 F (36.9 C)     Temp Source 04/22/21 1126 Oral     SpO2 04/22/21 1126 100 %     Weight 04/22/21 1121 190 lb (86.2 kg)     Height 04/22/21 1121 5\' 4"  (1.626 m)     Head Circumference --      Peak Flow --      Pain Score 04/22/21 1121 7     Pain Loc --      Pain Edu? --      Excl. in GC? --     Most recent vital signs: Vitals:   04/22/21 1126  BP: (!) 185/121  Pulse: 92  Resp: 20  Temp: 98.4 F (36.9 C)  SpO2: 100%    General: Awake, no distress.  CV:  Good peripheral perfusion.  No murmurs rubs or gallops.  2+ radial pulses. Resp:  Normal effort.  Clear bilaterally.  No increased effort. Abd:  No distention.  Soft throughout. Other:  Pain is reproducible on palpation of the left chest wall without any overlying skin changes.   ED Results /  Procedures / Treatments  Labs (all labs ordered are listed, but only abnormal results are displayed) Labs Reviewed  BASIC METABOLIC PANEL - Abnormal; Notable for the following components:      Result Value   CO2 20 (*)    Glucose, Bld 112 (*)    All other components within normal limits  CBC  POC URINE PREG, ED  TROPONIN I (HIGH SENSITIVITY)  TROPONIN I (HIGH SENSITIVITY)     EKG  EKG reviewed by myself shows sinus rhythm with a ventricular rate of 89 and some artifact in inferior leads without any other clear evidence of acute ischemia or significant arrhythmia.   RADIOLOGY  Chest x-ray reviewed by myself shows no focal consolidation, effusion, edema, pneumothorax or any other clear acute thoracic process.  Also reviewed radiology interpretation and agree with their findings.   PROCEDURES:  Critical Care performed: No  Procedures    MEDICATIONS ORDERED IN ED: Medications  irbesartan (AVAPRO) tablet 75 mg (has no administration in time range)    And  hydrochlorothiazide (HYDRODIURIL) tablet 12.5 mg (has no  administration in time range)     IMPRESSION / MDM / ASSESSMENT AND PLAN / ED COURSE  I reviewed the triage vital signs and the nursing notes.                              Differential diagnosis includes, but is not limited to pneumonia, pneumothorax, costochondritis versus other chest wall inflammation.  History and reproducibility is not suggestive of a dissection.  Low suspicion for PE as patient is PERC negative.  EKG reviewed by myself shows sinus rhythm with a ventricular rate of 89 and some artifact in inferior leads without any other clear evidence of acute ischemia or significant arrhythmia.  Low suspicion for ACS given troponin obtained greater than 3 hours after symptom onset.  Chest x-ray reviewed by myself shows no focal consolidation, effusion, edema, pneumothorax or any other clear acute thoracic process.  Also reviewed radiology interpretation and  agree with their findings.  CBC without leukocytosis or acute anemia.  Given absence of any focal consolidation or other abnormalities on chest x-ray without fever or leukocytosis I low suspicion for acute infectious process.  BMP without significant electrolyte or metabolic derangements  Given clear reproducibility of discomfort suspect likely some chest wall inflammation.  No evidence of shingles or rash.  I think patient's elevated blood pressure today is likely incidental I discussed this with patient and recommendation for close outpatient PCP follow-up so that she can have this rechecked and blood pressure medicines titrated as needed.  She is amenable this plan.  In meantime advised Tylenol and ibuprofen.  Discharged in stable condition.  Strict return precautions advised and discussed.      FINAL CLINICAL IMPRESSION(S) / ED DIAGNOSES   Final diagnoses:  Chest pain, unspecified type  Hypertension, unspecified type     Rx / DC Orders   ED Discharge Orders     None        Note:  This document was prepared using Dragon voice recognition software and may include unintentional dictation errors.   Gilles Chiquito, MD 04/22/21 1319

## 2021-04-22 NOTE — ED Notes (Signed)
Dr. Smith at bedside.

## 2021-04-22 NOTE — ED Notes (Signed)
Secure msg sent to pharmacy requesting irbesartan.

## 2021-04-22 NOTE — ED Triage Notes (Signed)
Pt to ED for left sided chest pain that started this am with intermittent shob that started today.  Denies n/v

## 2021-04-23 ENCOUNTER — Ambulatory Visit: Payer: Self-pay | Admitting: *Deleted

## 2021-04-23 NOTE — Telephone Encounter (Signed)
°  Chief Complaint: chest pain- was seen at ED yesterday Symptoms: midline chest pain Frequency: hurts when moves Pertinent Negatives: Patient denies  Disposition: [] ED /[x] Urgent Care (no appt availability in office) / [] Appointment(In office/virtual)/ []  [facility]/ [] Home Care/ [] Refused Recommended Disposition /[] [facility]/ []  Follow-up with PCP Additional Notes: Patient states she thought the ED was going to send Rx to pharmacy for BP medication- they asked her her pharmacy preference. Call to ED- after chart review - no notes reflecting that. Patient advised ED/UC as advised at discharge. Patient also given information on [facility] for follow up- no insurance.

## 2021-04-23 NOTE — Telephone Encounter (Signed)
Reason for Disposition  [1] Chest pain lasts > 5 minutes AND [2] occurred in past 3 days (72 hours) (Exception: feels exactly the same as previously diagnosed heartburn and has accompanying sour taste in mouth)  Answer Assessment - Initial Assessment Questions 1. LOCATION: "Where does it hurt?"       Middle of chest to shoulder 2. RADIATION: "Does the pain go anywhere else?" (e.g., into neck, jaw, arms, back)     *No Answer* 3. ONSET: "When did the chest pain begin?" (Minutes, hours or days)      Yesterday am- high BP -seen at ED 4. PATTERN "Does the pain come and go, or has it been constant since it started?"  "Does it get worse with exertion?"      Hurts when patient moves 5. DURATION: "How long does it last" (e.g., seconds, minutes, hours)     *No Answer* 6. SEVERITY: "How bad is the pain?"  (e.g., Scale 1-10; mild, moderate, or severe)    - MILD (1-3): doesn't interfere with normal activities     - MODERATE (4-7): interferes with normal activities or awakens from sleep    - SEVERE (8-10): excruciating pain, unable to do any normal activities       moderate 7. CARDIAC RISK FACTORS: "Do you have any history of heart problems or risk factors for heart disease?" (e.g., angina, prior heart attack; diabetes, high blood pressure, high cholesterol, smoker, or strong family history of heart disease)     High BP 8. PULMONARY RISK FACTORS: "Do you have any history of lung disease?"  (e.g., blood clots in lung, asthma, emphysema, birth control pills)     *No Answer* 9. CAUSE: "What do you think is causing the chest pain?"     *No Answer* 10. OTHER SYMPTOMS: "Do you have any other symptoms?" (e.g., dizziness, nausea, vomiting, sweating, fever, difficulty breathing, cough)       *No Answer* 11. PREGNANCY: "Is there any chance you are pregnant?" "When was your last menstrual period?"       *No Answer*  Protocols used: Chest Pain-A-AH

## 2022-03-10 IMAGING — CT CT HEAD W/O CM
3 series · 16 of 47 positions shown, 19 images · non-contrast
Comparison: None.

CLINICAL DATA: Headache.

EXAM:
CT HEAD WITHOUT CONTRAST
TECHNIQUE: Contiguous axial images were obtained from the base of the skull
through the vertex without intravenous contrast.

[Series 3: coronal soft tissue · coronal · 0.34mm/px · 3 of 65 slices shown]
[im 22/65  brain]
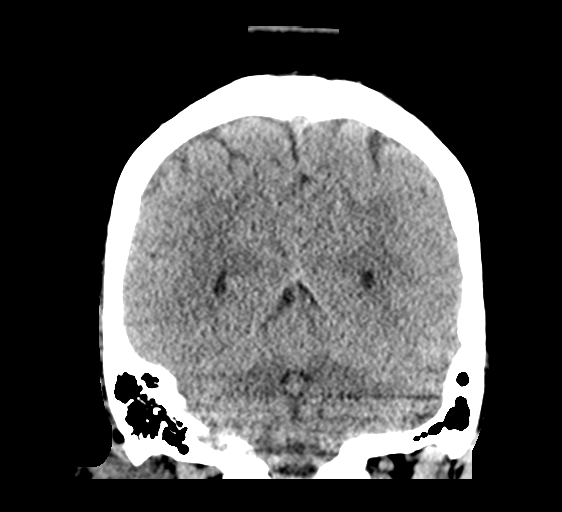
[im 29/65  brain]
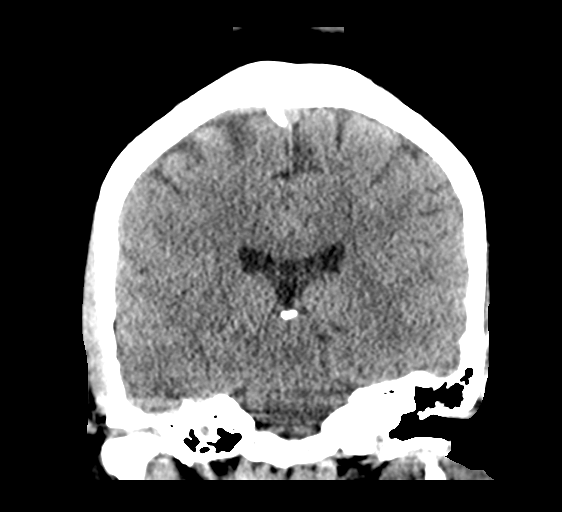
[im 36/65  brain]
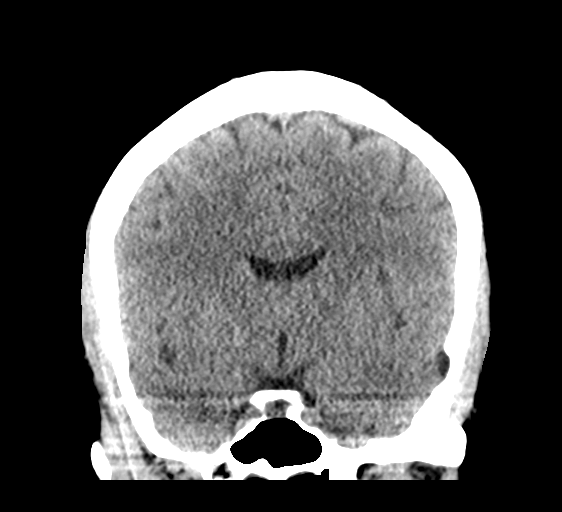

[Series 4: sagittal soft tissue · sagittal · 0.31mm/px · 3 of 50 slices shown]
[im 17/50  brain]
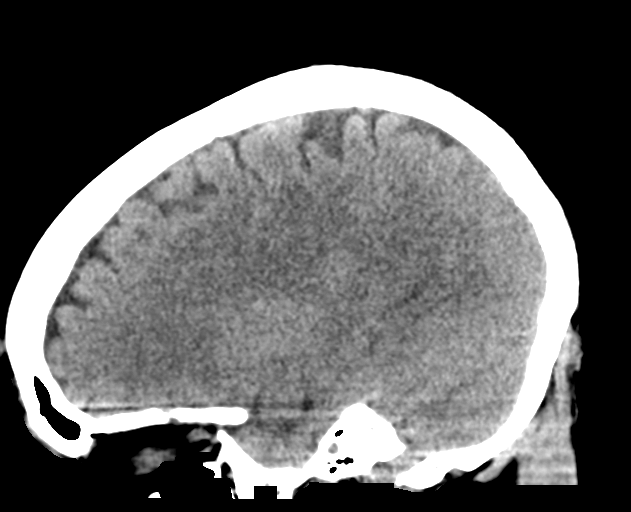
[im 25/50  brain]
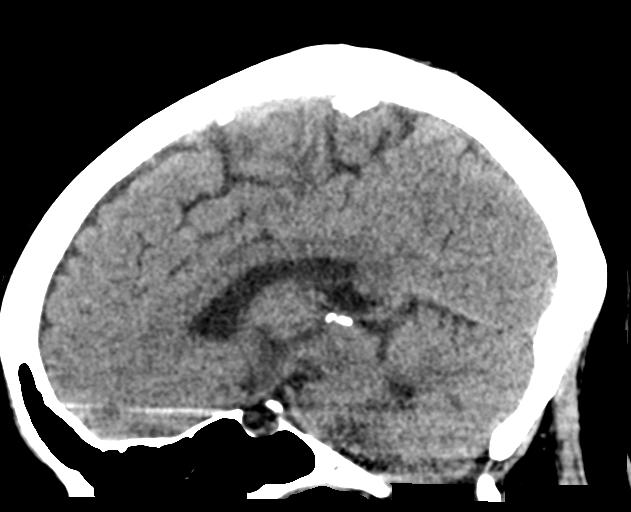
[im 33/50  brain]
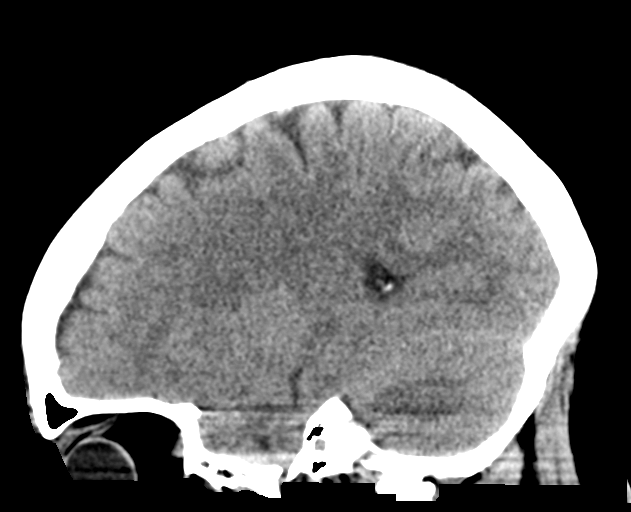

[Series 5: head wo · axial · 0.43mm/px · z∈[-236,-111]mm · 10 of 31 slices shown, 13 images]
[im 3/31  brain]
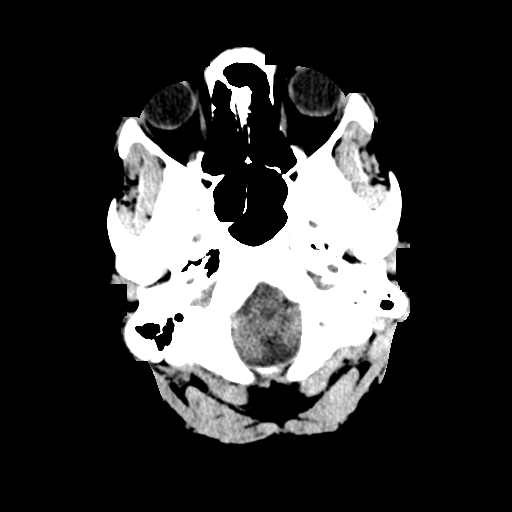
[im 3/31  bone]
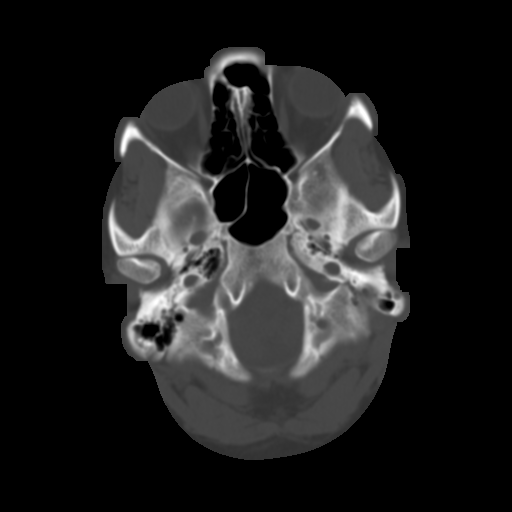
[im 6/31  brain]
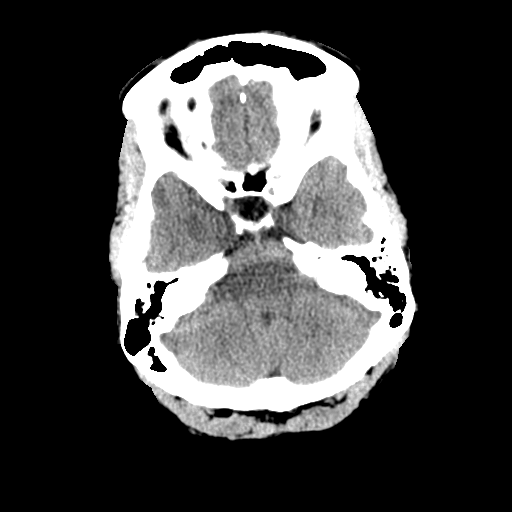
[im 9/31  brain]
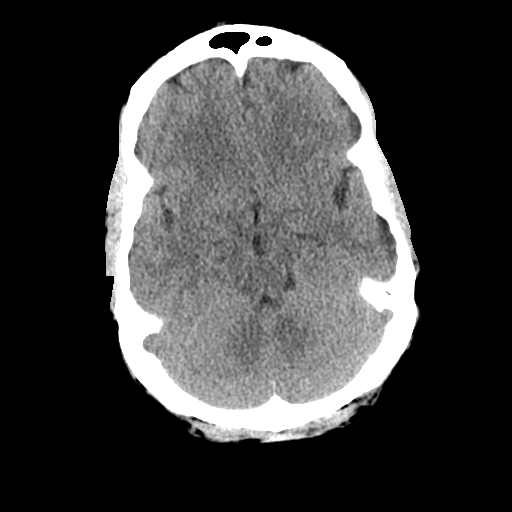
[im 11/31  brain]
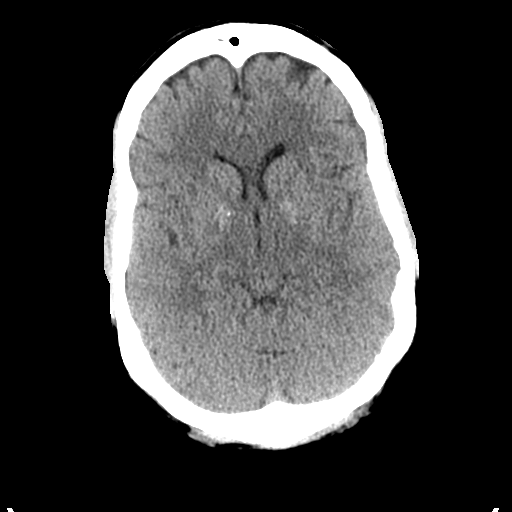
[im 14/31  brain]
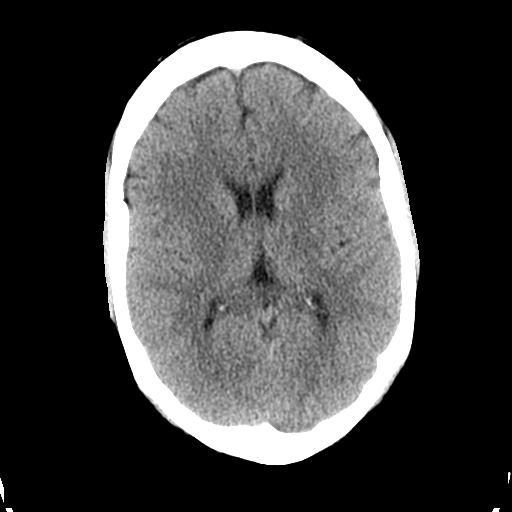
[im 14/31  bone]
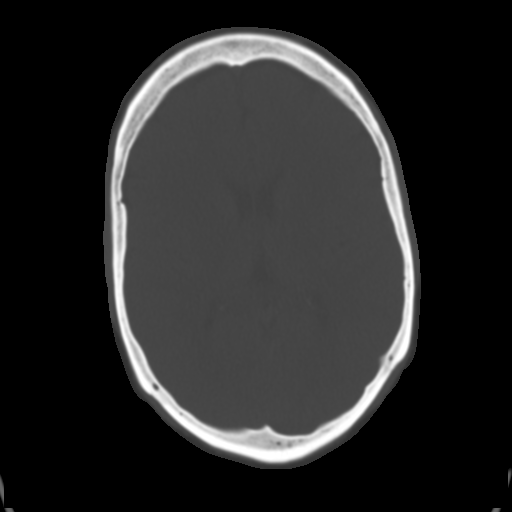
[im 17/31  brain]
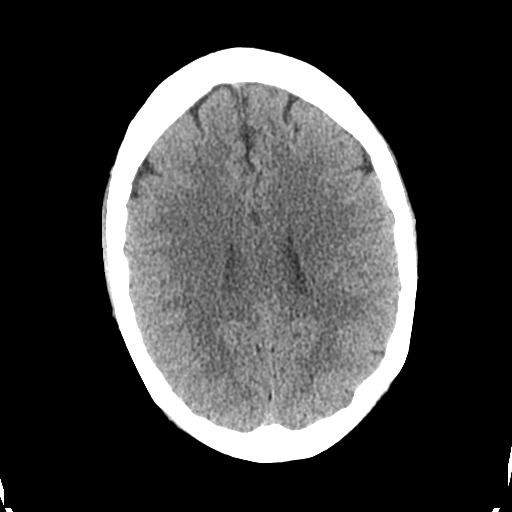
[im 20/31  brain]
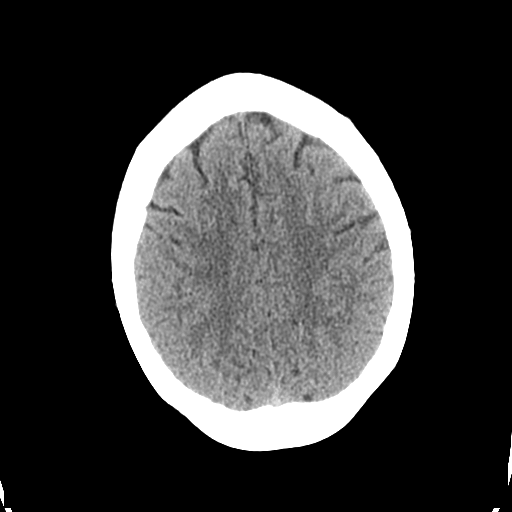
[im 23/31  brain]
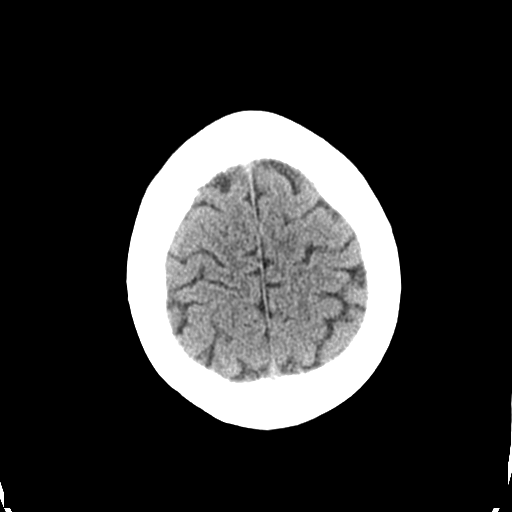
[im 25/31  brain]
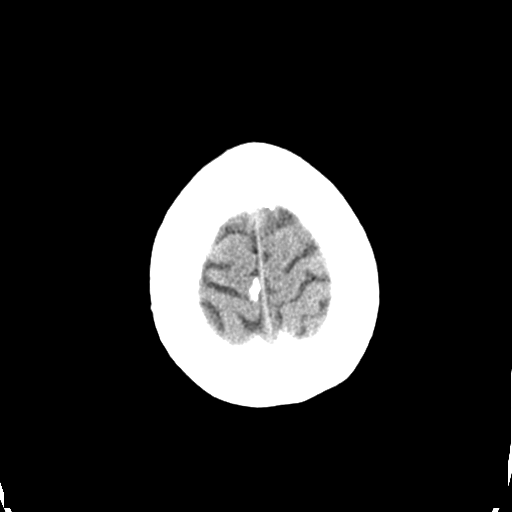
[im 25/31  bone]
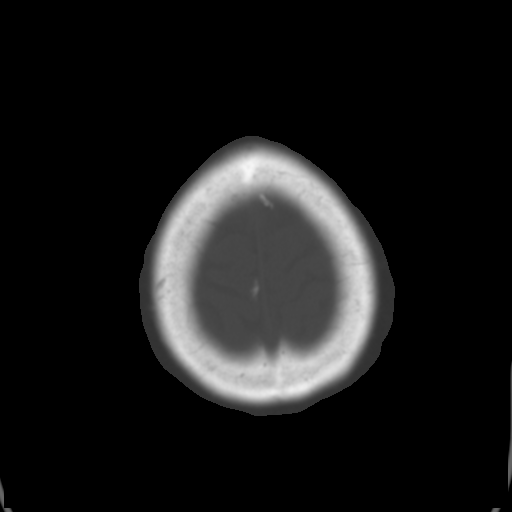
[im 28/31  brain]
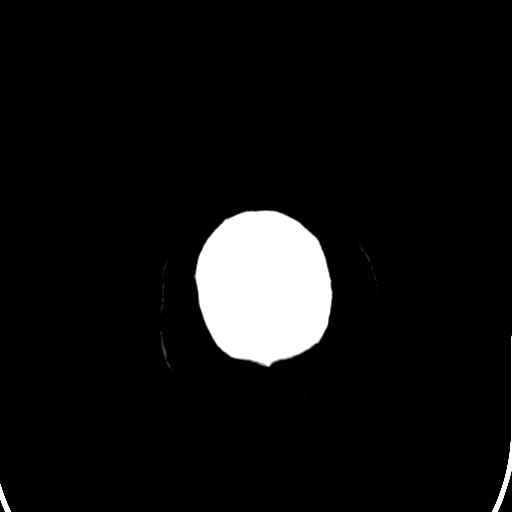

[16 of 47 positions shown; findings below may reference images not displayed]

FINDINGS: Brain: No evidence of acute infarction, hemorrhage, hydrocephalus,
extra-axial collection or mass lesion/mass effect.

Vascular: No hyperdense vessel or unexpected calcification.

Skull: Normal. Negative for fracture or focal lesion.

Sinuses/Orbits: No acute finding.

Other: None.
IMPRESSION: Normal head CT.

## 2023-05-20 IMAGING — CR DG CHEST 2V
1 series · 2 of 2 positions shown · non-contrast
Comparison: 04/05/2017, 08/14/2016

CLINICAL DATA: Chest pain

EXAM:
CHEST - 2 VIEW

[Series 1: w chest pa · 0.14mm/px · 2 of 2 slices shown]
[im 1/2]
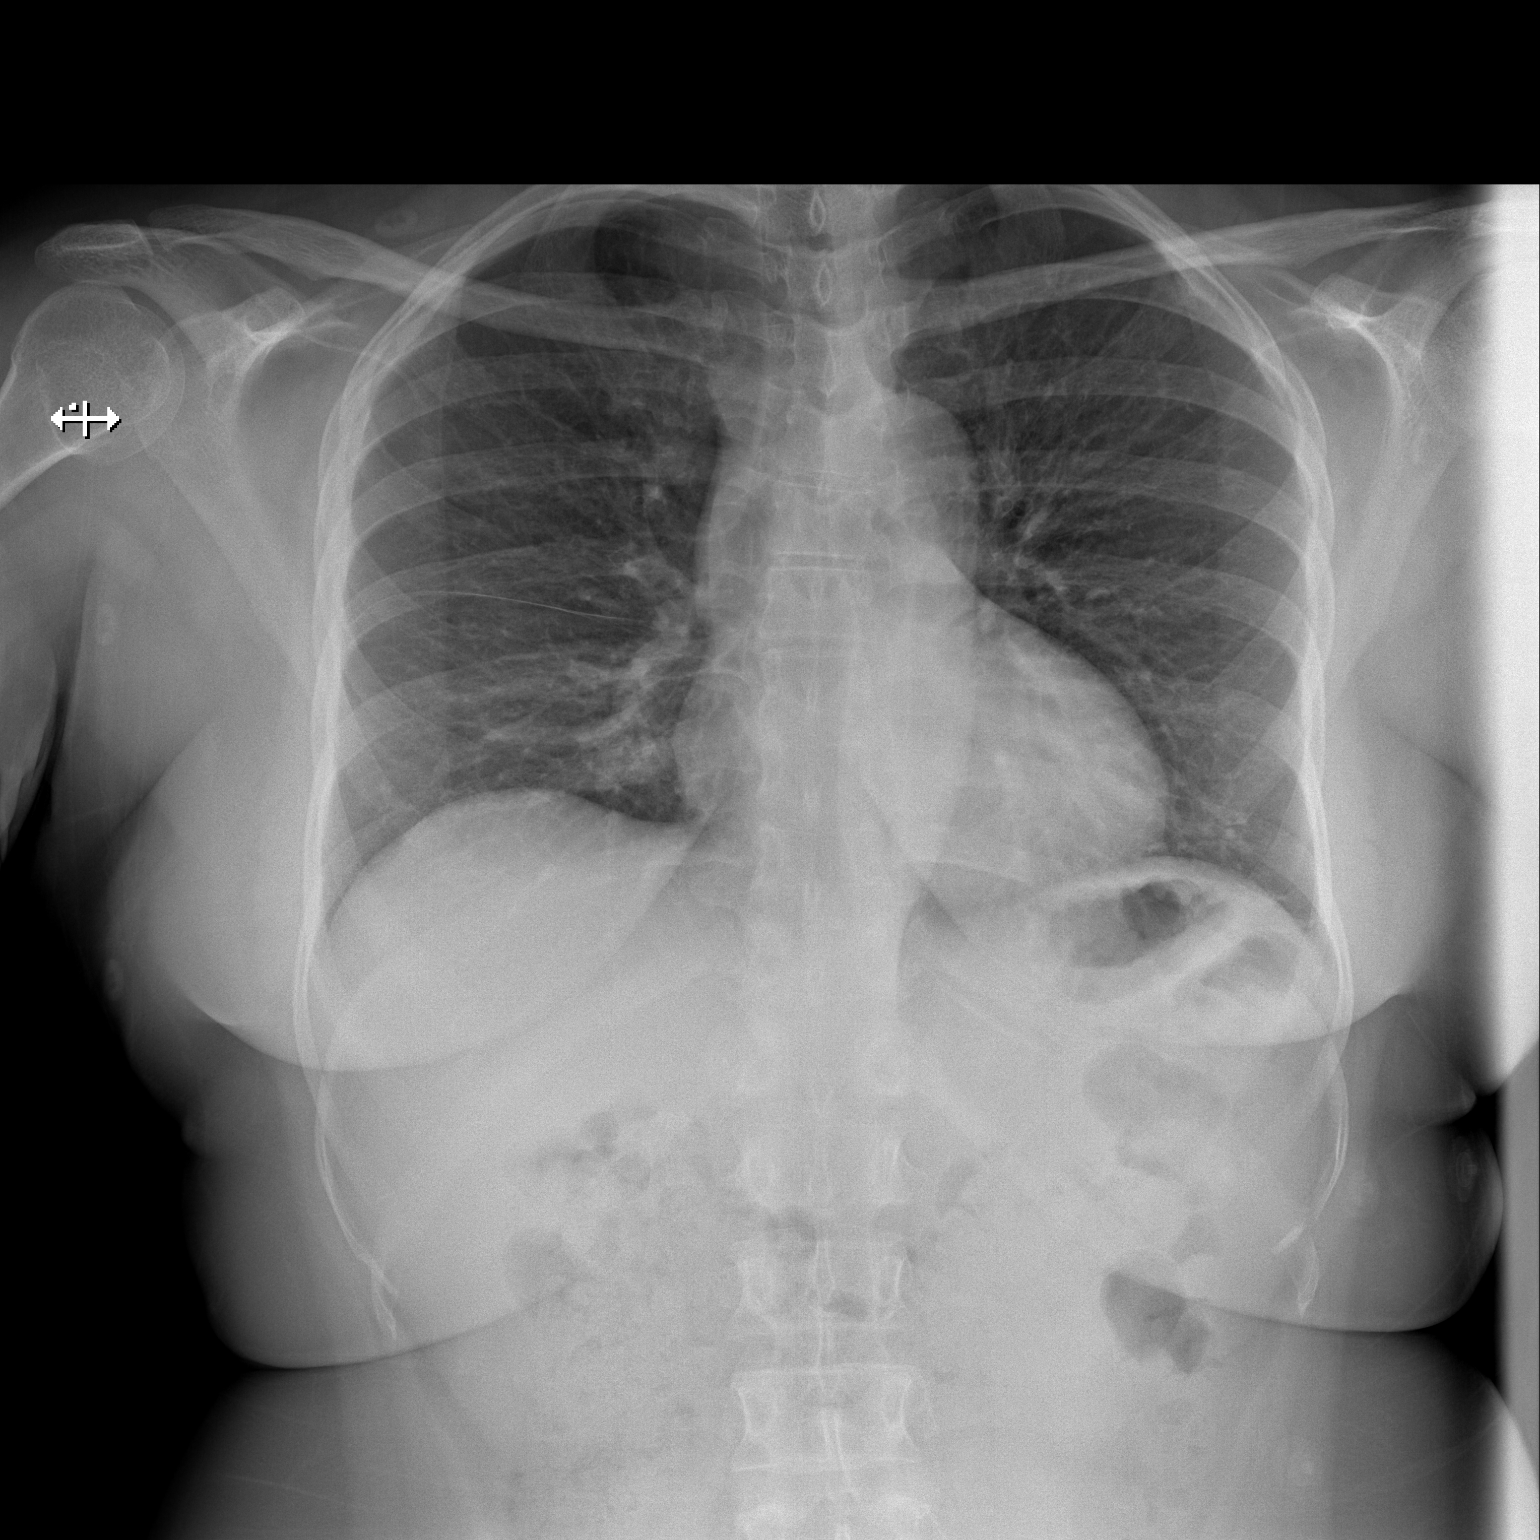
[im 2/2]
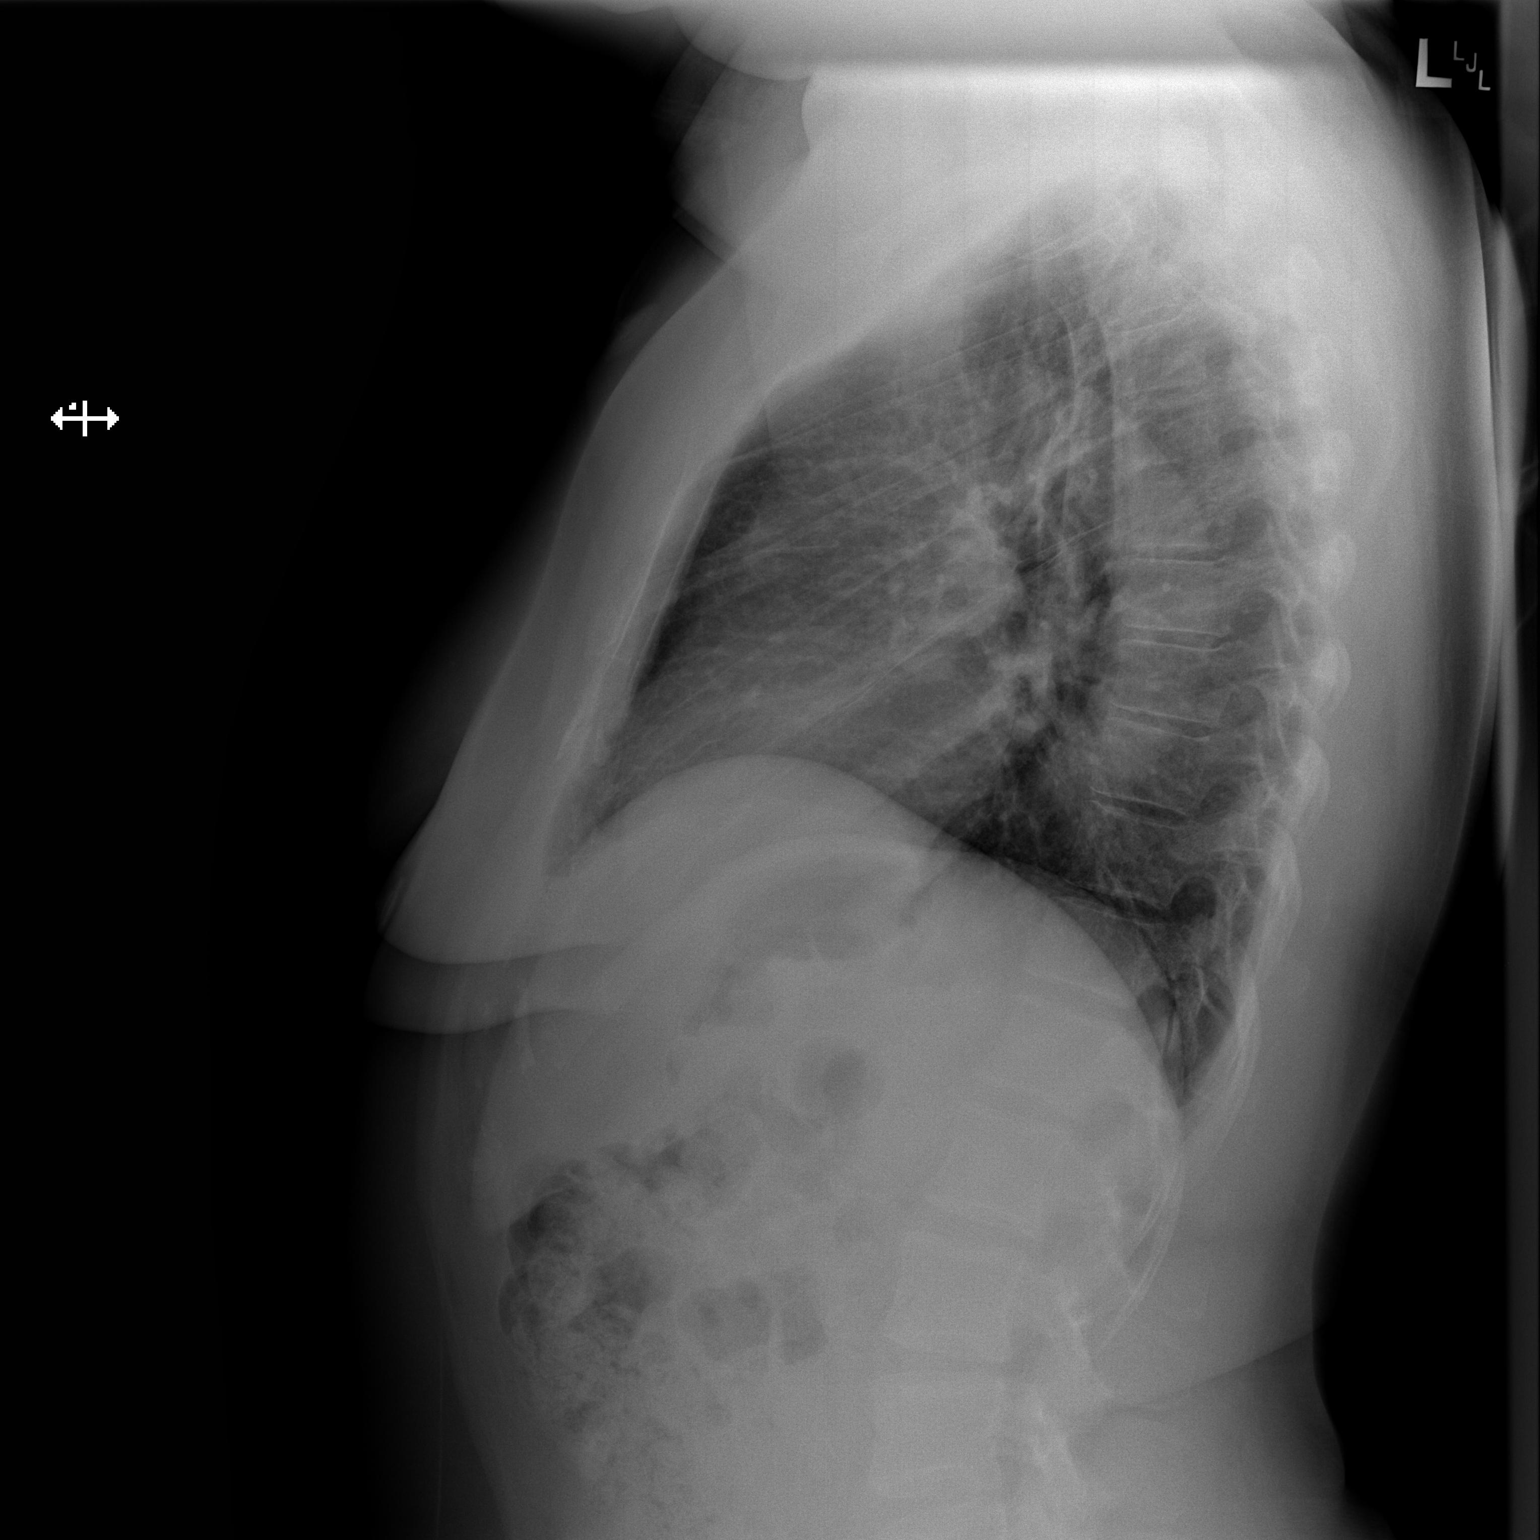

[2 of 2 positions shown; findings below may reference images not displayed]

FINDINGS: The heart size and mediastinal contours are within normal limits.
Both lungs are clear. The visualized skeletal structures are
unremarkable.
IMPRESSION: No active cardiopulmonary disease.
